# Patient Record
Sex: Female | Born: 1990 | Race: Black or African American | Hispanic: No | Marital: Single | Smoking: Current every day smoker
Health system: Southern US, Community
[De-identification: ages and names within clinical notes are randomized; demographics above are authoritative.]

## PROBLEM LIST (undated history)

## (undated) DIAGNOSIS — D649 Anemia, unspecified: Secondary | ICD-10-CM

## (undated) DIAGNOSIS — R519 Headache, unspecified: Secondary | ICD-10-CM

## (undated) DIAGNOSIS — F419 Anxiety disorder, unspecified: Secondary | ICD-10-CM

## (undated) DIAGNOSIS — F32A Depression, unspecified: Secondary | ICD-10-CM

## (undated) HISTORY — PX: ACHILLES TENDON REPAIR: SUR1153

## (undated) HISTORY — DX: Depression, unspecified: F32.A

## (undated) HISTORY — PX: TYMPANOSTOMY TUBE PLACEMENT: SHX32

## (undated) HISTORY — PX: APPENDECTOMY: SHX54

## (undated) HISTORY — DX: Headache, unspecified: R51.9

## (undated) HISTORY — PX: GASTRIC BYPASS: SHX52

## (undated) HISTORY — DX: Anemia, unspecified: D64.9

## (undated) HISTORY — DX: Anxiety disorder, unspecified: F41.9

---
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
## 2010-04-03 ENCOUNTER — Encounter: Payer: Self-pay | Admitting: *Deleted

## 2010-04-03 NOTE — Assessment & Plan Note (Signed)
Summary: headaches,tcb   Vital Signs:  Patient profile:   20 year old female Height:      66.5 inches Weight:      356.1 pounds BMI:     56.82 Temp:     97.5 degrees F oral Pulse rate:   116 / minute BP sitting:   147 / 93  (left arm) Cuff size:   large  Vitals Entered By: Gladstone Pih (April 22, 2009 3:55 PM)  Serial Vital Signs/Assessments:  Time      Position  BP       Pulse  Resp  Temp     By 3:56 PM             136/92                         Milinda Antis MD  Comments: 3:56 PM Re checked manually By: Milinda Antis MD   CC: Discuss  HA's Is Patient Diabetic? No Pain Assessment Patient in pain? no        Primary Care Provider:  Milinda Antis MD  CC:  Discuss  HA's.  History of Present Illness:   1. headaches- headaches started at the beginning of Feb. Occur 3-4x a week and last up to 2 hours. Pain described as throbbing on both temporals that moves to back of head. No photophobia, no phonophobia, no nasuea, no change in vision. Partial relief with execedrin. Brother recently diagnosed with headaches and cousin with migraines, no parethesia or weakness on ROS  No recent illness    Habits & Providers  Alcohol-Tobacco-Diet     Tobacco Status: never  Current Medications (verified): 1)  Benzamycin 5-3 % Gel (Benzoyl Peroxide-Erythromycin) .... Apply To Affected Areas On Face Two Times A Day After Washing and Drying Dispense 1 Large Tube 2)  Seasonique 0.15-0.03 &0.01 Mg Tabs (Levonorgest-Eth Estrad 91-Day) .Marland Kitchen.. 1 By Mouth Daily  Dispense 90 Day Supply 3)  Ibuprofen 600 Mg Tabs (Ibuprofen) .Marland Kitchen.. 1 By Mouth Q 6hrs As Needed Headache  Allergies (verified): No Known Drug Allergies  Review of Systems       per hpi  Physical Exam  General:  morbidly obese, NAD Vital signs noted, BP rechecked HrR 88  Head:  normocephalic and atraumatic.   Eyes:  . EOMI. Perrla. Funduscopic exam benign, without hemorrhages, exudates or papilledema. Vision grossly  normal. Heart:  RRR   Pulses:  2+ Neurologic:  alert & oriented X3, cranial nerves II-XII intact, strength normal in all extremities, sensation intact to light touch,, no clonus normal gait reflexes 1+ in lower ext, 2+upper ext- equal bilat     Impression & Recommendations:  Problem # 1:  HEADACHE (ICD-784.0) Assessment New   No clear diagnosis of migraine disorder, pt feeling some stress from school also with elevated BP which can be contributing to this . See below for headache diary, use Motrin prn Her updated medication list for this problem includes:    Ibuprofen 600 Mg Tabs (Ibuprofen) .Marland Kitchen... 1 by mouth q 6hrs as needed headache  Orders: [facility]- Est  Level 4 (99214)  Problem # 2:  ELEVATED BLOOD PRESSURE WITHOUT DIAGNOSIS OF HYPERTENSION (ICD-796.2)  Persistantly elevated bp, with weight gain/ Pt to keep BP diary for 3 weeks, if still elevated will start low dose  HCTZ or BB if still having headaches  Orders: [facility]- Est  Level 4 (04540)  Problem # 3:  OBESITY, NOS (ICD-278.00) Assessment: Deteriorated  I discussed her  weight gain and need for exercise, incrased risk for DM, heart disease. Pt is very young and while she understands does not have any motivation it appers to change at this time  Orders: [facility]- Est  Level 4 (99214)  Complete Medication List: 1)  Benzamycin 5-3 % Gel (Benzoyl peroxide-erythromycin) .... Apply to affected areas on face two times a day after washing and drying dispense 1 large tube 2)  Seasonique 0.15-0.03 &0.01 Mg Tabs (Levonorgest-eth estrad 91-day) .Marland Kitchen.. 1 by mouth daily  dispense 90 day supply 3)  Ibuprofen 600 Mg Tabs (Ibuprofen) .Marland Kitchen.. 1 by mouth q 6hrs as needed headache  Patient Instructions: 1)  Headache diary- write down when it happens, what you were doing, how long it lasted and if you took any medication- any aura (other feelings at that time) 2)  Iburprofen 600mg  at the onset of headache, every 6 hours as needed, no more than 2 a day 3)   Write down your blood pressure twice a week 4)   Come back to 3 weeks Prescriptions: SEASONIQUE 0.15-0.03 &0.01 MG TABS (LEVONORGEST-ETH ESTRAD 91-DAY) 1 by mouth daily  dispense 90 day supply  #1 x 3   Entered and Authorized by:   Milinda Antis MD   Signed by:   Milinda Antis MD on 04/22/2009   Method used:   Electronically to        [facility] [address] [phone]* (retail)       [address]       [place], [place]  [address]       Ph: [phone]       Fax: [phone]   RxID:   [id number] IBUPROFEN 600 MG TABS (IBUPROFEN) 1 by mouth q 6hrs as needed headache  #40 x 1   Entered and Authorized by:   Milinda Antis MD   Signed by:   Milinda Antis MD on 04/22/2009   Method used:   Electronically to        [facility] [address] [phone]* (retail)       [address]       [place], [place]  [address]       Ph: [phone]       Fax: [phone]   RxID:   [id number]

## 2010-04-03 NOTE — Letter (Signed)
Summary: Probation Letter  [facility]  [address]   [place], [place] [address]   Phone: [phone]  Fax: [phone]    07/08/2009  Judith Estes [address] [place], [place]  [address]  Dear Judith Estes,  With the goal of better serving all our patients the [facility] is following each patient's missed appointments.  You have missed at least 3 appointments with our practice.If you cannot keep your appointment, we expect you to call at least 24 hours before your appointment time.  Missing appointments prevents other patients from seeing [place] and makes it difficult to provide you with the best possible medical care.      1.   If you miss one more appointment, we will only give you limited medical services. This means we will not call in medication refills, complete a form, or make a referral for you except when you are here for a scheduled office visit.    2.   If you miss 2 or more appointments in the next year, we will dismiss you from our practice.    Our office staff can be reached at [phone] Monday through Friday from 8:30 a.m.-5:00 p.m. and will be glad to schedule your appointment as necessary.    Thank you.   [facility]  Appended Document: Probation Letter cert mailed  Appended Document: Probation Letter Letter returned unable to forward.

## 2010-04-03 NOTE — Miscellaneous (Signed)
Summary: Missed Derm Appt   Clinical Lists Changes  Pt missed dermatology appt on 01/09/09 with [facility] Dr. Leonie Man, she needs to reschedule this.

## 2010-05-15 LAB — LIPASE, BLOOD: Lipase: 20 U/L (ref 11–59)

## 2010-05-15 LAB — CBC
HCT: 40.6 % (ref 36.0–46.0)
Hemoglobin: 13.9 g/dL (ref 12.0–15.0)
MCH: 30.8 pg (ref 26.0–34.0)
MCV: 90.2 fL (ref 78.0–100.0)
Platelets: 318 10*3/uL (ref 150–400)
RBC: 4.5 MIL/uL (ref 3.87–5.11)
WBC: 10.6 10*3/uL — ABNORMAL HIGH (ref 4.0–10.5)

## 2010-05-15 LAB — COMPREHENSIVE METABOLIC PANEL
Albumin: 3.4 g/dL — ABNORMAL LOW (ref 3.5–5.2)
Alkaline Phosphatase: 60 U/L (ref 39–117)
BUN: 7 mg/dL (ref 6–23)
CO2: 26 mEq/L (ref 19–32)
Chloride: 107 mEq/L (ref 96–112)
Creatinine, Ser: 0.7 mg/dL (ref 0.4–1.2)
GFR calc non Af Amer: >60 mL/min (ref >60)
Glucose, Bld: 93 mg/dL (ref 70–99)
Potassium: 4.2 mEq/L (ref 3.5–5.1)
Total Bilirubin: 0.4 mg/dL (ref 0.3–1.2)

## 2010-05-15 LAB — DIFFERENTIAL
Basophils Absolute: 0 10*3/uL (ref 0.0–0.1)
Basophils Relative: 0 % (ref 0–1)
Monocytes Absolute: 0.7 10*3/uL (ref 0.1–1.0)
Neutro Abs: 7.1 10*3/uL (ref 1.7–7.7)

## 2010-05-15 LAB — URINALYSIS, ROUTINE W REFLEX MICROSCOPIC
Bilirubin Urine: NEGATIVE
Hgb urine dipstick: NEGATIVE
Nitrite: NEGATIVE
Protein, ur: NEGATIVE mg/dL
Urobilinogen, UA: 0.2 mg/dL (ref 0.0–1.0)

## 2010-06-27 ENCOUNTER — Ambulatory Visit: Payer: Self-pay | Admitting: Family Medicine

## 2010-07-03 ENCOUNTER — Ambulatory Visit: Payer: Self-pay | Admitting: Family Medicine

## 2010-08-26 ENCOUNTER — Telehealth: Payer: Self-pay | Admitting: Family Medicine

## 2010-08-26 ENCOUNTER — Emergency Department (HOSPITAL_COMMUNITY)
Admission: EM | Admit: 2010-08-26 | Discharge: 2010-08-27 | Disposition: A | Discharge status: Home / Self Care | Payer: Medicaid Other | Attending: Emergency Medicine | Admitting: Emergency Medicine

## 2010-08-26 DIAGNOSIS — IMO0001 Reserved for inherently not codable concepts without codable children: Secondary | ICD-10-CM | POA: Insufficient documentation

## 2010-08-26 DIAGNOSIS — R05 Cough: Secondary | ICD-10-CM | POA: Insufficient documentation

## 2010-08-26 DIAGNOSIS — R059 Cough, unspecified: Secondary | ICD-10-CM | POA: Insufficient documentation

## 2010-08-26 DIAGNOSIS — J069 Acute upper respiratory infection, unspecified: Secondary | ICD-10-CM | POA: Insufficient documentation

## 2010-08-26 NOTE — Telephone Encounter (Signed)
Received call from pt about 2-3 days of nasal congestion, runny nose, sore throat, generalized malaise. Unsure of sick contacts. No vomiting, diarrhea. No abd pain. No fever. Has been tolerating po well. Mild headache. No vision changes, hemi-weakness. Told pt that she is likely suffering from a viral illness. Told pt to come in am for work in visit in am if sxs still persistent. Discussed red flags for ED evaluation including intractable vomiting, diarrhea, fever despite antipyretics. Pt agreeable to plan.

## 2011-01-09 IMAGING — US US ABDOMEN COMPLETE
1 series · 13 of 25 positions shown · non-contrast
Comparison: None.

CLINICAL DATA: 19-year-old female with abdominal pain.

ABDOMINAL ULTRASOUND COMPLETE

[Series 1: us abdomen complete · 0.30mm/px · 13 of 57 slices shown]
[im 1/57]
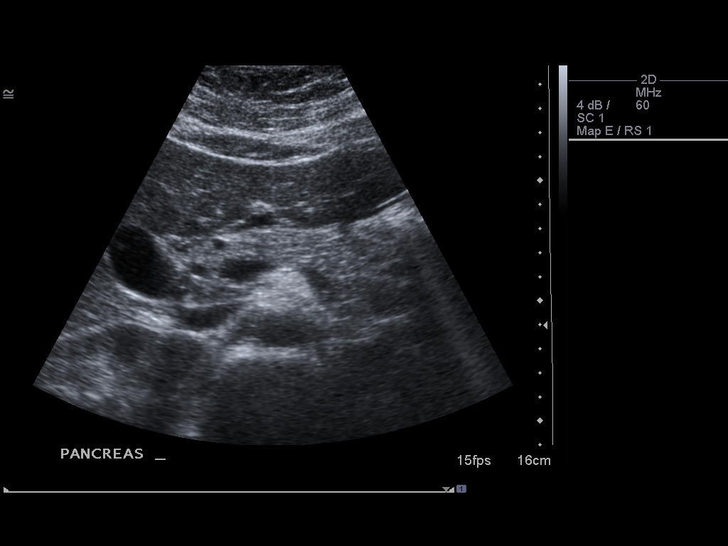
[im 5/57]
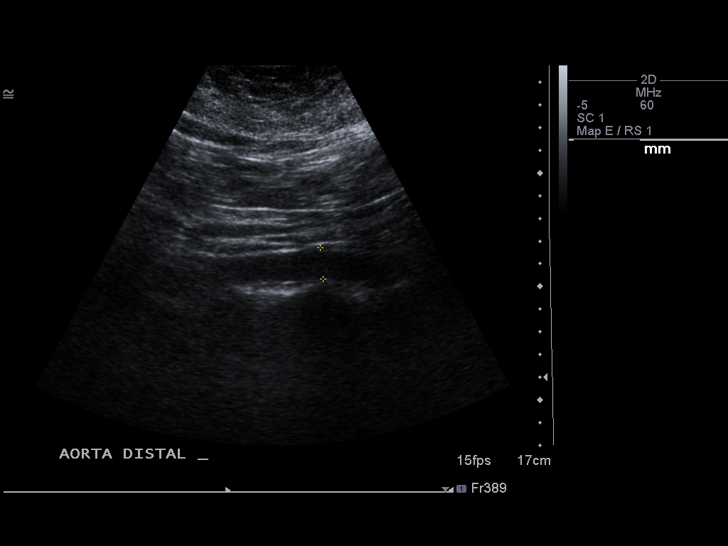
[im 10/57]
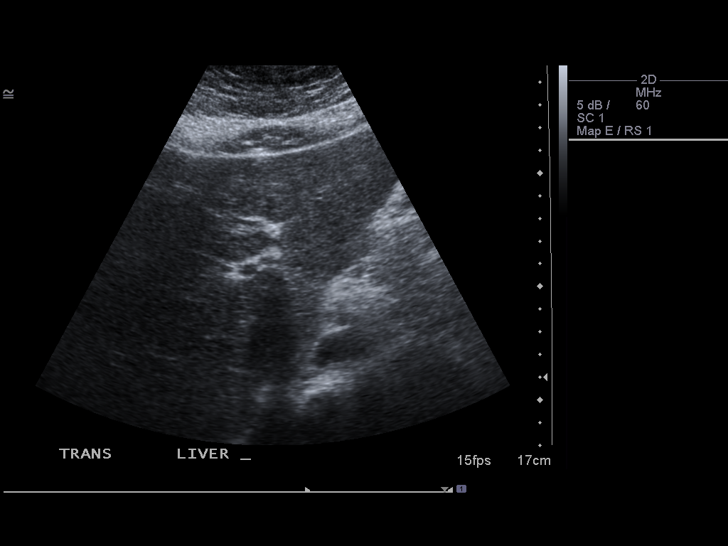
[im 15/57]
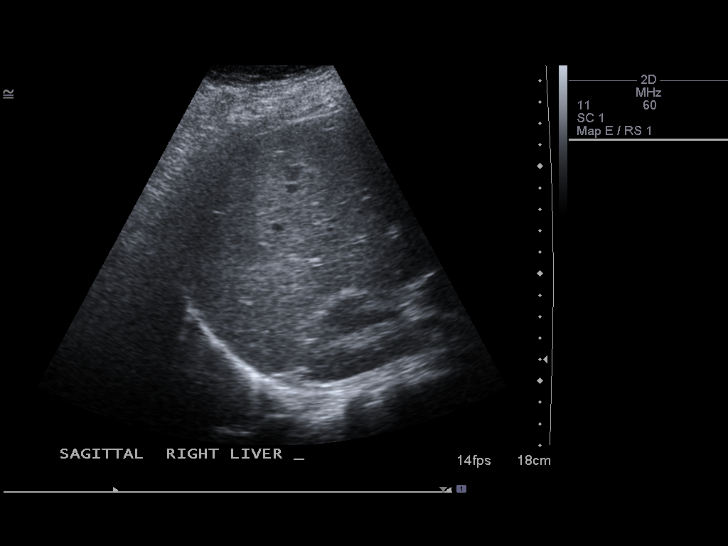
[im 19/57]
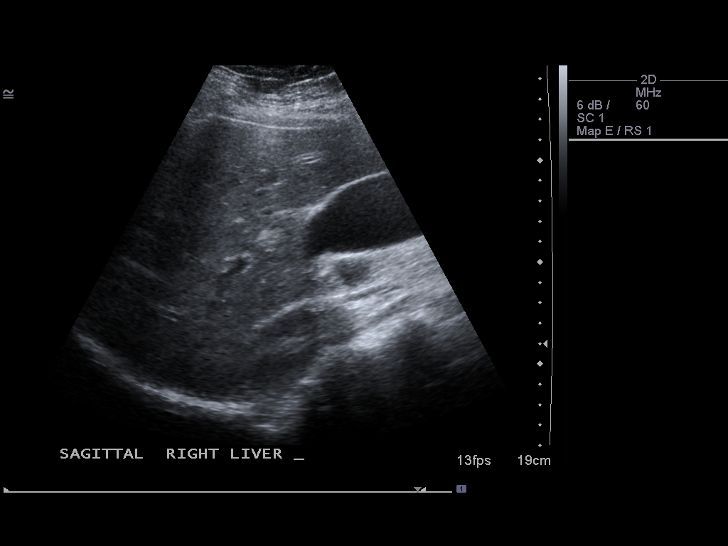
[im 24/57]
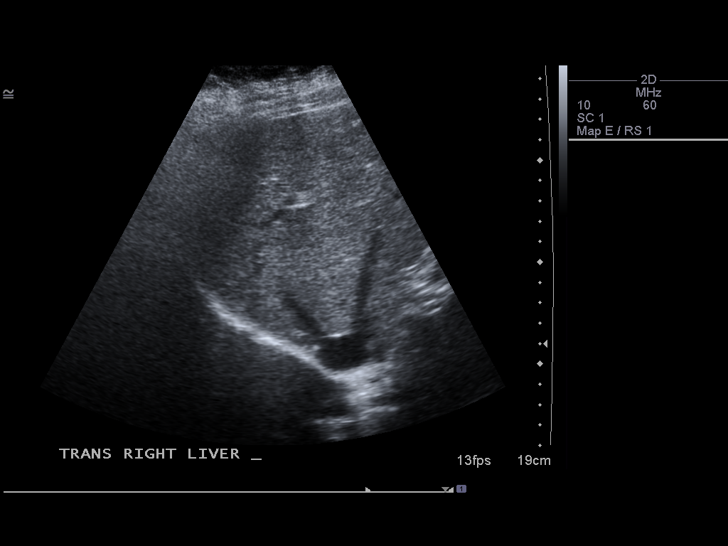
[im 29/57]
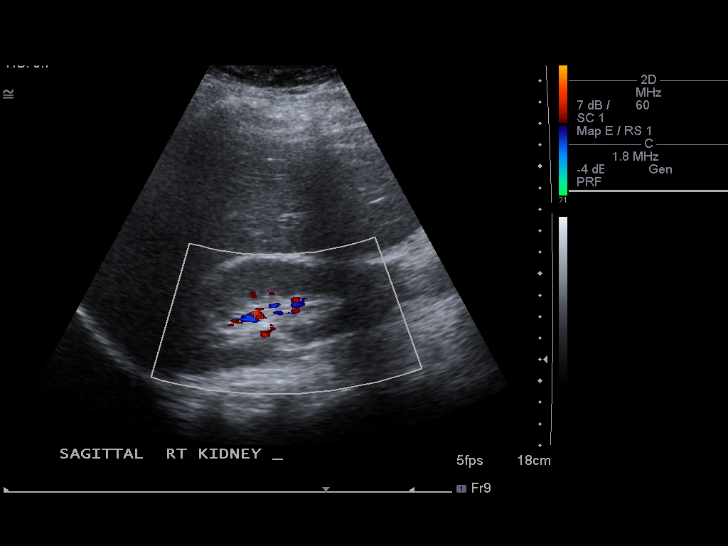
[im 33/57]
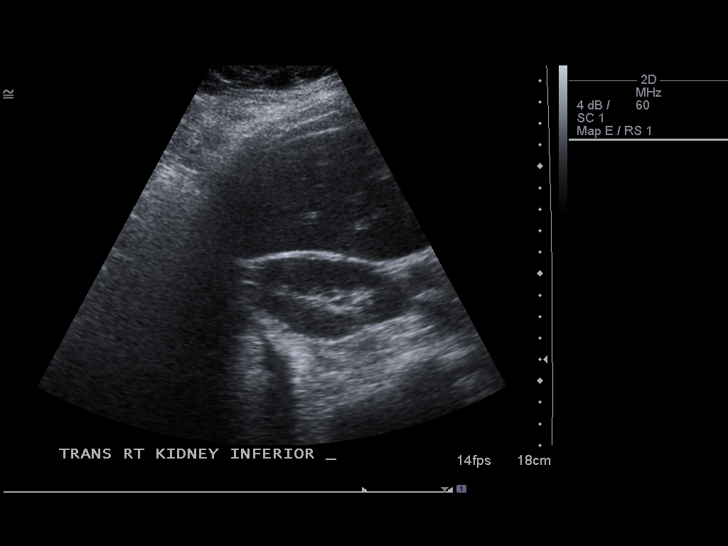
[im 38/57]
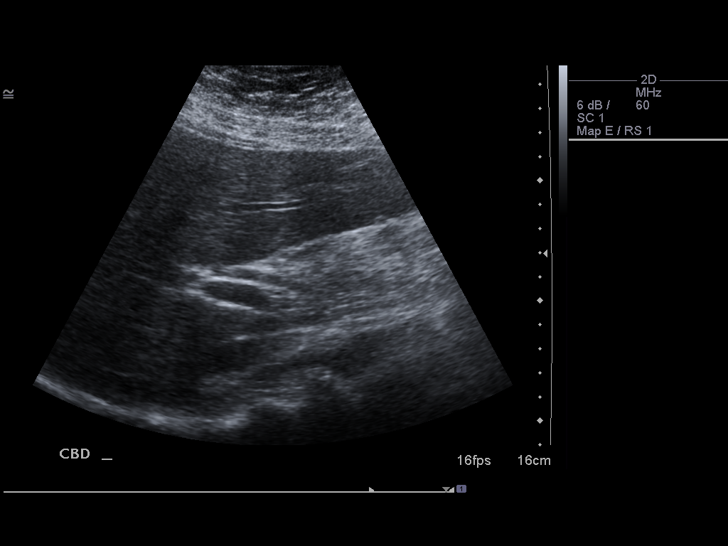
[im 43/57]
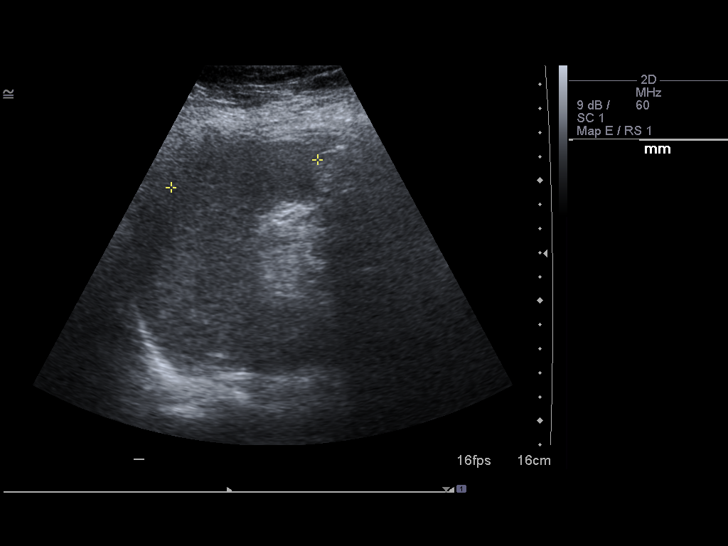
[im 47/57]
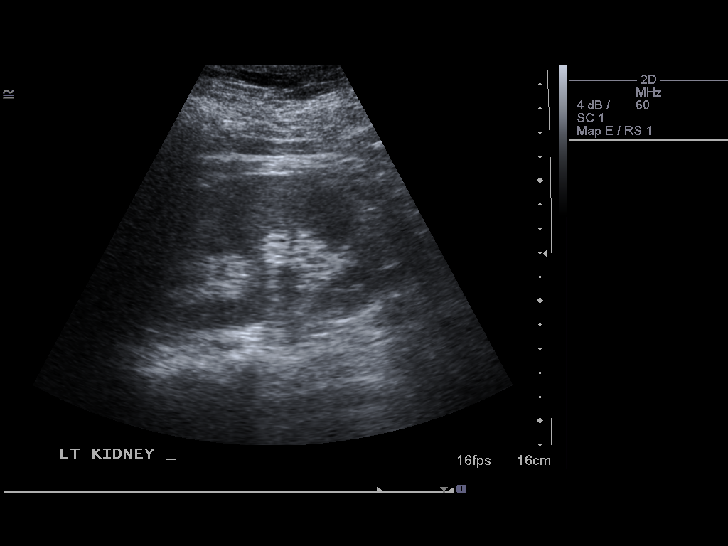
[im 52/57]
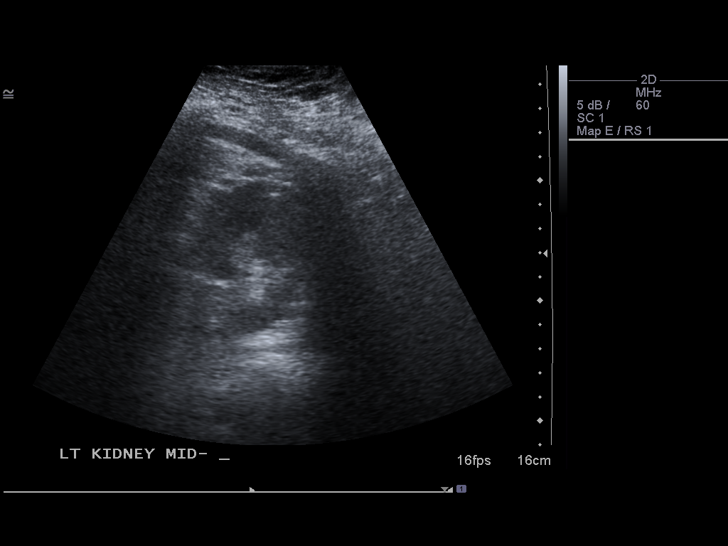
[im 57/57]
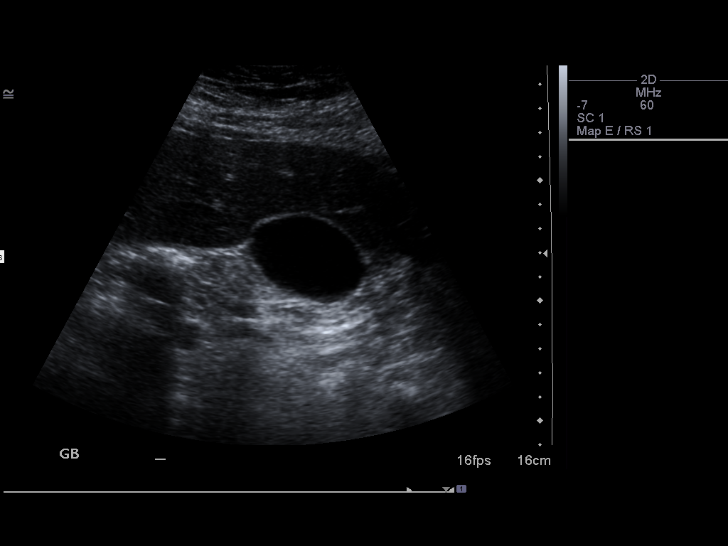

[13 of 25 positions shown; findings below may reference images not displayed]

FINDINGS: Gallbladder:  The gallbladder is unremarkable. There is no evidence
of gallstones, gallbladder wall thickening, or pericholecystic
fluid.

Common Bile Duct:  There is no evidence of intrahepatic or
extrahepatic biliary dilation. The CBD measures 4.4 mm in greatest
diameter.

Liver:  The liver is within normal limits in parenchymal
echogenicity. No focal abnormalities are identified.

IVC:  Appears normal.

Pancreas:  Although the pancreas is difficult to visualize in its
entirety, no focal pancreatic abnormality is identified.

Spleen:  Within normal limits in size and echotexture.

Right kidney:  The right kidney is normal in size and parenchymal
echogenicity.  There is no evidence of solid mass, hydronephrosis
or definite renal calculi.  The right kidney measures 10.8 cm.

Left kidney:  The left kidney is normal in size and parenchymal
echogenicity.  There is no evidence of solid mass, hydronephrosis
or definite renal calculi.   The left kidney measures 11.7 cm.

Abdominal Aorta:  No abdominal aortic aneurysm identified.

There is no evidence of ascites.
IMPRESSION: Negative abdominal ultrasound.

## 2011-04-28 ENCOUNTER — Encounter: Payer: Medicaid Other | Admitting: Emergency Medicine

## 2013-05-05 DIAGNOSIS — N915 Oligomenorrhea, unspecified: Secondary | ICD-10-CM | POA: Insufficient documentation

## 2013-05-05 DIAGNOSIS — G43109 Migraine with aura, not intractable, without status migrainosus: Secondary | ICD-10-CM | POA: Insufficient documentation

## 2015-09-30 DIAGNOSIS — G4730 Sleep apnea, unspecified: Secondary | ICD-10-CM | POA: Insufficient documentation

## 2015-09-30 DIAGNOSIS — G4700 Insomnia, unspecified: Secondary | ICD-10-CM | POA: Insufficient documentation

## 2016-03-21 DIAGNOSIS — K3580 Unspecified acute appendicitis: Secondary | ICD-10-CM | POA: Insufficient documentation

## 2016-09-08 ENCOUNTER — Telehealth: Payer: Self-pay

## 2016-09-08 NOTE — Telephone Encounter (Signed)
Immunization Record faxed to [phone] per patient request.  Clovis PuMartin, Kayde Warehime L, RN

## 2016-09-08 NOTE — Telephone Encounter (Signed)
Immunization printed, waiting for fax number.  Clovis PuMartin, Tamika L, RN

## 2016-09-08 NOTE — Telephone Encounter (Signed)
Patient calling requesting a copy of her shot record for a new job she is starting. [place] now [place]. She did not have the fax number available where she would like the shot sent. Patients states she will call back with the fax number. Please call patient if there is a issue with obtaining her shot record. [phone].

## 2020-05-06 ENCOUNTER — Emergency Department (HOSPITAL_COMMUNITY): Payer: BC Managed Care – PPO

## 2020-05-06 ENCOUNTER — Emergency Department (HOSPITAL_COMMUNITY)
Admission: EM | Admit: 2020-05-06 | Discharge: 2020-05-06 | Disposition: A | Discharge status: Home / Self Care | Payer: BC Managed Care – PPO | Attending: Emergency Medicine | Admitting: Emergency Medicine

## 2020-05-06 ENCOUNTER — Encounter (HOSPITAL_COMMUNITY): Payer: Self-pay

## 2020-05-06 ENCOUNTER — Other Ambulatory Visit: Payer: Self-pay

## 2020-05-06 DIAGNOSIS — J020 Streptococcal pharyngitis: Secondary | ICD-10-CM | POA: Diagnosis not present

## 2020-05-06 DIAGNOSIS — Z20822 Contact with and (suspected) exposure to covid-19: Secondary | ICD-10-CM | POA: Insufficient documentation

## 2020-05-06 DIAGNOSIS — R059 Cough, unspecified: Secondary | ICD-10-CM | POA: Diagnosis present

## 2020-05-06 LAB — RESP PANEL BY RT-PCR (FLU A&B, COVID) ARPGX2
Influenza A by PCR: NEGATIVE
Influenza B by PCR: NEGATIVE
SARS Coronavirus 2 by RT PCR: NEGATIVE

## 2020-05-06 LAB — GROUP A STREP BY PCR: Group A Strep by PCR: DETECTED — AB

## 2020-05-06 MED ORDER — PENICILLIN G BENZATHINE 1200000 UNIT/2ML IM SUSP
1.2000 10*6.[IU] | Freq: Once | INTRAMUSCULAR | Status: AC
  Administered 2020-05-06: 1.2 10*6.[IU] via INTRAMUSCULAR
  Filled 2020-05-06: qty 2

## 2020-05-06 MED ORDER — DEXAMETHASONE SODIUM PHOSPHATE 10 MG/ML IJ SOLN
10.0000 mg | Freq: Once | INTRAMUSCULAR | Status: AC
  Administered 2020-05-06: 10 mg via INTRAMUSCULAR
  Filled 2020-05-06: qty 1

## 2020-05-06 NOTE — Discharge Instructions (Signed)
Please follow up with your primary care provider within 5-7 days for re-evaluation of your symptoms. If you do not have a primary care provider, information for a healthcare clinic has been provided for you to make arrangements for follow up care. Please return to the emergency department for any new or worsening symptoms. ° °

## 2020-05-06 NOTE — ED Notes (Signed)
An After Visit Summary was printed and given to the patient. Discharge instructions given and no further questions at this time.  

## 2020-05-06 NOTE — ED Provider Notes (Signed)
[facility]-EMERGENCY DEPT Provider Note   CSN: [id number] Arrival date & time: 05/06/20  1446     History Chief Complaint  Patient presents with  . Chills  . Cough  . Generalized Body Aches    Judith Estes is a 30 y.o. female.  HPI   30 year old female presenting emergency department today for evaluation of URI symptoms.  States for the last week she has had a sore throat, cough, body aches, sweats, chills and subjective fevers.  She has been vaccinated against Covid and denies any known exposures.  She has been taking Tylenol without resolution of symptoms.  History reviewed. No pertinent past medical history.  Patient Active Problem List   Diagnosis Date Noted  . UPPER RESPIRATORY INFECTION, ACUTE 01/04/2009  . ELEVATED BLOOD PRESSURE WITHOUT DIAGNOSIS OF HYPERTENSION 01/04/2009  . 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 CONTUSION OF KNEE 08/06/2008  . ALLERGIC RHINITIS, SEASONAL 06/20/2008  . SHOULDER PAIN, RIGHT 06/20/2008  . Headache(784.0) 06/20/2008  . OBESITY, NOS 04/29/2006    History reviewed. No pertinent surgical history.   OB History   No obstetric history on file.     History reviewed. No pertinent family history.     Home Medications Prior to Admission medications   Medication Sig Start Date End Date Taking? Authorizing Provider  benzoyl peroxide-erythromycin (BENZAMYCIN) gel Apply topically 2 (two) times daily. to affected areas on face after washing and drying     [provider]  ibuprofen (ADVIL,MOTRIN) 600 MG tablet Take 600 mg by mouth every 6 (six) hours as needed. for headache     [provider]  Judith Estes 91-Day (SEASONIQUE) 0.15-0.03 &0.01 MG TABS Take 1 tablet by mouth daily.      [provider]    Allergies    Patient has no known allergies.  Review of Systems   Review of Systems  Constitutional: Positive for chills, diaphoresis and fever.  HENT: Positive for sore  throat.   Eyes: Negative for visual disturbance.  Respiratory: Positive for cough. Negative for shortness of breath.   Cardiovascular: Negative for chest pain.  Gastrointestinal: Negative for abdominal pain.  Genitourinary: Negative for pelvic pain.  Musculoskeletal: Positive for myalgias. Negative for back pain.  Neurological: Positive for headaches.    Physical Exam Updated Vital Signs BP 138/88   Pulse 89   Temp 100.2 F (37.9 C) (Oral)   Resp 18   Ht 5\' 6"  (1.676 m)   Wt 125.2 kg   LMP 04/12/2020 (Approximate)   SpO2 99%   BMI 44.55 kg/m   Physical Exam Vitals and nursing note reviewed.  Constitutional:      General: She is not in acute distress.    Appearance: She is well-developed and well-nourished.  HENT:     Head: Normocephalic and atraumatic.     Mouth/Throat:     Pharynx: Uvula midline. Oropharyngeal exudate and posterior oropharyngeal erythema present.     Tonsils: Tonsillar exudate present. No tonsillar abscesses. 2+ on the right. 2+ on the left.  Eyes:     Conjunctiva/sclera: Conjunctivae normal.  Cardiovascular:     Rate and Rhythm: Normal rate and regular rhythm.     Heart sounds: No murmur heard.   Pulmonary:     Effort: Pulmonary effort is normal. No respiratory distress.     Breath sounds: Normal breath sounds.  Abdominal:     Palpations: Abdomen is soft.     Tenderness: There is no abdominal tenderness.  Musculoskeletal:  General: No edema.     Cervical back: Neck supple.  Skin:    General: Skin is warm and dry.  Neurological:     Mental Status: She is alert.  Psychiatric:        Mood and Affect: Mood and affect normal.     ED Results / Procedures / Treatments   Labs (all labs ordered are listed, but only abnormal results are displayed) Labs Reviewed  GROUP A STREP BY PCR - Abnormal; Notable for the following components:      Result Value   Group A Strep by PCR DETECTED (*)    All other components within normal limits  RESP  PANEL BY RT-PCR (FLU A&B, COVID) [id number]    EKG None  Radiology DG Chest Portable 1 View  Result Date: 05/06/2020 CLINICAL DATA:  Cough, chills and body aches x1 week. EXAM: PORTABLE CHEST 1 VIEW COMPARISON:  Jul 05, 2007 FINDINGS: The heart size and mediastinal contours are within normal limits. Both lungs are clear. The visualized skeletal structures are unremarkable. IMPRESSION: No active disease. Electronically Signed   By: Aram Candela M.D.   On: 05/06/2020 16:12    Procedures Procedures   Medications Ordered in ED Medications  penicillin g benzathine (BICILLIN LA) 1200000 UNIT/2ML injection 1.2 Million Units (has no administration in time range)  dexamethasone (DECADRON) injection 10 mg (has no administration in time range)    ED Course  I have reviewed the triage vital signs and the nursing notes.  Pertinent labs & imaging results that were available during my care of the patient were reviewed by me and considered in my medical decision making (see chart for details).    MDM Rules/Calculators/A&P                          Pt febrile with tonsillar exudate, cervical lymphadenopathy, & dysphagia; diagnosis of strep. Treated in the Ed with steroids  and PCN IM. Presentation non concerning for PTA or infxn spread to soft tissue. No trismus or uvula deviation. covid test neg and cxr neg. Tessalon given for cough. Specific return precautions discussed. Pt able to drink water in ED without difficulty with intact air way. Recommended PCP follow up.    Final Clinical Impression(s) / ED Diagnoses Final diagnoses:  Strep throat  Cough    Rx / DC Orders ED Discharge Orders    None       Judith Estes 05/06/20 1854    Judith Fines, MD 05/07/20 1451

## 2020-05-06 NOTE — ED Triage Notes (Signed)
Pt presents with c/o cough, chills, body aches since last week.

## 2020-05-10 ENCOUNTER — Emergency Department (HOSPITAL_COMMUNITY)
Admission: EM | Admit: 2020-05-10 | Discharge: 2020-05-11 | Disposition: A | Discharge status: Home / Self Care | Payer: BC Managed Care – PPO | Attending: Emergency Medicine | Admitting: Emergency Medicine

## 2020-05-10 ENCOUNTER — Encounter (HOSPITAL_COMMUNITY): Payer: Self-pay

## 2020-05-10 ENCOUNTER — Other Ambulatory Visit: Payer: Self-pay

## 2020-05-10 DIAGNOSIS — F17210 Nicotine dependence, cigarettes, uncomplicated: Secondary | ICD-10-CM | POA: Insufficient documentation

## 2020-05-10 DIAGNOSIS — J029 Acute pharyngitis, unspecified: Secondary | ICD-10-CM | POA: Diagnosis not present

## 2020-05-10 DIAGNOSIS — J020 Streptococcal pharyngitis: Secondary | ICD-10-CM

## 2020-05-10 LAB — CBC WITH DIFFERENTIAL/PLATELET
Band Neutrophils: 2 %
Basophils Relative: 0 %
Blasts: 0 %
Eosinophils Relative: 0 %
HCT: 42.3 % (ref 36.0–46.0)
Hemoglobin: 14 g/dL (ref 12.0–15.0)
Immature Granulocytes: 0 %
Lymphocytes Relative: 13 %
MCH: 30.8 pg (ref 26.0–34.0)
MCHC: 33.1 g/dL (ref 30.0–36.0)
MCV: 93.2 fL (ref 80.0–100.0)
Metamyelocytes Relative: 0 %
Monocytes Relative: NONREACTIVE %
Myelocytes: 0 %
Neutrophils Relative %: 71 %
Platelets: 246 10*3/uL (ref 150–400)
Promyelocytes Relative: 0 %
RBC: 4.54 MIL/uL (ref 3.87–5.11)
RDW: 13.7 % (ref 11.5–15.5)
WBC: 10 10*3/uL (ref 4.0–10.5)
nRBC: 0 % (ref 0.0–0.2)
nRBC: 1 /100 WBC — ABNORMAL HIGH

## 2020-05-10 LAB — BASIC METABOLIC PANEL
Anion gap: 12 (ref 5–15)
BUN: 9 mg/dL (ref 6–20)
CO2: 20 mmol/L — ABNORMAL LOW (ref 22–32)
Calcium: 8.7 mg/dL — ABNORMAL LOW (ref 8.9–10.3)
Chloride: 103 mmol/L (ref 98–111)
Creatinine, Ser: 0.85 mg/dL (ref 0.44–1.00)
GFR, Estimated: >60 mL/min (ref >60)
Glucose, Bld: 89 mg/dL (ref 70–99)
Potassium: 3.9 mmol/L (ref 3.5–5.1)
Sodium: 135 mmol/L (ref 135–145)

## 2020-05-10 LAB — MONONUCLEOSIS SCREEN: Mono Screen: NONREACTIVE — AB

## 2020-05-10 LAB — I-STAT BETA HCG BLOOD, ED (MC, WL, AP ONLY): I-stat hCG, quantitative: <5 m[IU]/mL (ref <5)

## 2020-05-10 MED ORDER — LIDOCAINE VISCOUS HCL 2 % MT SOLN: 10.0000 mL | Freq: Four times a day (QID) | OROMUCOSAL | 0 refills | Status: DC | PRN

## 2020-05-10 MED ORDER — SODIUM CHLORIDE 0.9 % IV BOLUS
1000.0000 mL | Freq: Once | INTRAVENOUS | Status: AC
  Administered 2020-05-10: 1000 mL via INTRAVENOUS

## 2020-05-10 MED ORDER — KETOROLAC TROMETHAMINE 30 MG/ML IJ SOLN
30.0000 mg | Freq: Once | INTRAMUSCULAR | Status: AC
  Administered 2020-05-10: 30 mg via INTRAVENOUS
  Filled 2020-05-10: qty 1

## 2020-05-10 MED ORDER — DEXAMETHASONE SODIUM PHOSPHATE 10 MG/ML IJ SOLN
10.0000 mg | Freq: Once | INTRAMUSCULAR | Status: DC
  Filled 2020-05-10: qty 1

## 2020-05-10 MED ORDER — NAPROXEN 375 MG PO TABS: 375.0000 mg | ORAL_TABLET | Freq: Two times a day (BID) | ORAL | 0 refills | Status: DC

## 2020-05-10 MED ORDER — DEXAMETHASONE SODIUM PHOSPHATE 10 MG/ML IJ SOLN
10.0000 mg | Freq: Once | INTRAMUSCULAR | Status: AC
  Administered 2020-05-10: 10 mg via INTRAVENOUS

## 2020-05-10 MED ORDER — LIDOCAINE VISCOUS HCL 2 % MT SOLN
15.0000 mL | Freq: Once | OROMUCOSAL | Status: AC
  Administered 2020-05-10: 15 mL via OROMUCOSAL
  Filled 2020-05-10: qty 15

## 2020-05-10 MED ORDER — HYDROCODONE-HOMATROPINE 5-1.5 MG/5ML PO SYRP: 5.0000 mL | ORAL_SOLUTION | Freq: Four times a day (QID) | ORAL | 0 refills | Status: DC | PRN

## 2020-05-10 NOTE — ED Notes (Addendum)
Patient refused strep and Covid tests. PA made aware.

## 2020-05-10 NOTE — ED Triage Notes (Signed)
patient states she was seen  In the ED 4 days and was diagnosed with strep throat. Patient states her throat is worse and is having difficulty swallowing.

## 2020-05-10 NOTE — Discharge Instructions (Signed)
Contact a health care provider if: The glands in your neck continue to get bigger. You develop a rash, cough, or earache. You cough up a thick mucus that is green, yellow-brown, or bloody. You have pain or discomfort that does not get better with medicine. Your symptoms seem to be getting worse and not better. You have a fever. Get help right away if: You have new symptoms, such as vomiting, severe headache, stiff or painful neck, chest pain, or shortness of breath. You have severe throat pain, drooling, or changes in your voice. You have swelling of the neck, or the skin on the neck becomes red and tender. You have signs of dehydration, such as tiredness (fatigue), dry mouth, and decreased urination. You become increasingly sleepy, or you cannot wake up completely. Your joints become red or painful.

## 2020-05-10 NOTE — ED Provider Notes (Signed)
[facility]-EMERGENCY DEPT Provider Note   CSN: [id number] Arrival date & time: 05/10/20  1841     History Chief Complaint  Patient presents with  . Sore Throat  . Generalized Body Aches    Judith Estes is a 30 y.o. female who presents emergency department chief complaint of sore throat.  Patient was seen Monday of this week, had a strep test which was positive and a Covid test which was negative.  She was treated with penicillin and steroid states that she got no relief and nothing seemed to get better.  2 days later she went to the urgent care where she was started on clindamycin and given another dose of steroid without any improvement.  She states that she cannot tolerate swallowing her own saliva but just barely and has not been able to drink or eat.  HPI     History reviewed. No pertinent past medical history.  Patient Active Problem List   Diagnosis Date Noted  . UPPER RESPIRATORY INFECTION, ACUTE 01/04/2009  . ELEVATED BLOOD PRESSURE WITHOUT DIAGNOSIS OF HYPERTENSION 01/04/2009  . 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 CONTUSION OF KNEE 08/06/2008  . ALLERGIC RHINITIS, SEASONAL 06/20/2008  . SHOULDER PAIN, RIGHT 06/20/2008  . Headache(784.0) 06/20/2008  . OBESITY, NOS 04/29/2006    No past surgical history on file.   OB History   No obstetric history on file.     Family History  Problem Relation Age of Onset  . Hypertension Mother   . Hypertension Father     Social History   Tobacco Use  . Smoking status: Current Every Day Smoker    Packs/day: 0.25    Types: Cigarettes  . Smokeless tobacco: Never Used  Substance Use Topics  . Alcohol use: Never  . Drug use: Never    Home Medications Prior to Admission medications   Medication Sig Start Date End Date Taking? Authorizing Provider  benzoyl peroxide-erythromycin (BENZAMYCIN) gel Apply topically 2 (two) times daily. to affected areas on face after washing and drying      [provider]  ibuprofen (ADVIL,MOTRIN) 600 MG tablet Take 600 mg by mouth every 6 (six) hours as needed. for headache     [provider]  Terri Piedra 91-Day (SEASONIQUE) 0.15-0.03 &0.01 MG TABS Take 1 tablet by mouth daily.      [provider]    Allergies    Patient has no known allergies.  Review of Systems   Review of Systems Ten systems reviewed and are negative for acute change, except as noted in the HPI.   Physical Exam Updated Vital Signs BP 113/68   Pulse 65   Temp 100 F (37.8 C) (Oral)   Resp 18   Ht 5\' 6"  (1.676 m)   Wt 126.1 kg   LMP 04/12/2020 (Approximate)   SpO2 95%   BMI 44.87 kg/m   Physical Exam Vitals and nursing note reviewed.  Constitutional:      General: She is not in acute distress.    Appearance: She is well-developed. She is not diaphoretic.  HENT:     Head: Normocephalic and atraumatic.     Right Ear: Tympanic membrane normal.     Left Ear: Tympanic membrane normal.     Mouth/Throat:     Pharynx: Uvula midline.     Tonsils: Tonsillar exudate and tonsillar abscess present. 3+ on the right. 3+ on the left.  Eyes:     General: No scleral icterus.  Conjunctiva/sclera: Conjunctivae normal.  Cardiovascular:     Rate and Rhythm: Normal rate and regular rhythm.     Heart sounds: Normal heart sounds. No murmur heard. No friction rub. No gallop.   Pulmonary:     Effort: Pulmonary effort is normal. No respiratory distress.     Breath sounds: Normal breath sounds.  Abdominal:     General: Bowel sounds are normal. There is no distension.     Palpations: Abdomen is soft. There is no mass.     Tenderness: There is no abdominal tenderness. There is no guarding.  Musculoskeletal:     Cervical back: Normal range of motion.  Skin:    General: Skin is warm and dry.  Neurological:     Mental Status: She is alert and oriented to person, place, and time.  Psychiatric:        Behavior: Behavior normal.      ED Results / Procedures / Treatments   Labs (all labs ordered are listed, but only abnormal results are displayed) Labs Reviewed  CBC WITH DIFFERENTIAL/PLATELET - Abnormal; Notable for the following components:      Result Value   nRBC 1 (*)    All other components within normal limits  BASIC METABOLIC PANEL - Abnormal; Notable for the following components:   CO2 20 (*)    Calcium 8.7 (*)    All other components within normal limits  MONONUCLEOSIS SCREEN - Abnormal; Notable for the following components:   Mono Screen NON REACTIVE (*)    All other components within normal limits  I-STAT BETA HCG BLOOD, ED ([facility], [facility], [facility] ONLY)  GC/CHLAMYDIA PROBE AMP ([facility]) NOT AT [facility]    EKG None  Radiology No results found.  Procedures Procedures   Medications Ordered in ED Medications  sodium chloride 0.9 % bolus 1,000 mL (0 mLs Intravenous Stopped 05/10/20 2158)  ketorolac (TORADOL) 30 MG/ML injection 30 mg (30 mg Intravenous Given 05/10/20 2032)  lidocaine (XYLOCAINE) 2 % viscous mouth solution 15 mL (15 mLs Mouth/Throat Given 05/10/20 2026)  dexamethasone (DECADRON) injection 10 mg (10 mg Intravenous Given 05/10/20 2032)    ED Course  I have reviewed the triage vital signs and the nursing notes.  Pertinent labs & imaging results that were available during my care of the patient were reviewed by me and considered in my medical decision making (see chart for details).    MDM Rules/Calculators/A&P                         30 year old female here with significant sore throat.  Thick almost pseudomembranous exudates over bilateral tonsils, tongue is coated.  I ordered and reviewed labs which included pregnancy test, CBC without abnormality, BMP with mildly low bicarb of insignificant value.  She does have atypical lymphocytes which may indicate a positive mono test however her mono test was negative today.  Patient was given fluids, Decadron, Toradol and viscous lidocaine and states  that she is feeling somewhat better.  Her heart rate has normalized with fluids.  PDMP reviewed during this encounter. Discharge patient with Hycodan, viscous lidocaine and naproxen.  She has clindamycin at home and may continue taking this medication.  Appears otherwise appropriate for discharge at this time and may follow-up with ENT.  Final Clinical Impression(s) / ED Diagnoses Final diagnoses:  None    Rx / DC Orders ED Discharge Orders    None       Arthor Captain, PA-C 05/10/20  3235    Rolan Bucco, MD 05/10/20 2355

## 2020-05-12 LAB — GC/CHLAMYDIA PROBE AMP (~~LOC~~) NOT AT ARMC
Chlamydia: NEGATIVE
Comment: NEGATIVE
Comment: NORMAL
Neisseria Gonorrhea: NEGATIVE

## 2020-07-15 DIAGNOSIS — D649 Anemia, unspecified: Secondary | ICD-10-CM | POA: Diagnosis not present

## 2020-07-15 DIAGNOSIS — R42 Dizziness and giddiness: Secondary | ICD-10-CM | POA: Diagnosis not present

## 2020-09-04 DIAGNOSIS — M25572 Pain in left ankle and joints of left foot: Secondary | ICD-10-CM | POA: Diagnosis not present

## 2020-09-13 DIAGNOSIS — M6788 Other specified disorders of synovium and tendon, other site: Secondary | ICD-10-CM | POA: Diagnosis not present

## 2020-09-13 DIAGNOSIS — M79672 Pain in left foot: Secondary | ICD-10-CM | POA: Diagnosis not present

## 2020-09-13 DIAGNOSIS — Z87828 Personal history of other (healed) physical injury and trauma: Secondary | ICD-10-CM | POA: Diagnosis not present

## 2020-09-23 ENCOUNTER — Ambulatory Visit: Payer: 59 | Admitting: Podiatry

## 2020-09-30 ENCOUNTER — Other Ambulatory Visit: Payer: Self-pay

## 2020-09-30 ENCOUNTER — Encounter: Payer: Self-pay | Admitting: Podiatry

## 2020-09-30 ENCOUNTER — Ambulatory Visit (INDEPENDENT_AMBULATORY_CARE_PROVIDER_SITE_OTHER): Payer: 59 | Admitting: Podiatry

## 2020-09-30 DIAGNOSIS — G8929 Other chronic pain: Secondary | ICD-10-CM | POA: Diagnosis not present

## 2020-09-30 DIAGNOSIS — M7662 Achilles tendinitis, left leg: Secondary | ICD-10-CM

## 2020-09-30 DIAGNOSIS — M79672 Pain in left foot: Secondary | ICD-10-CM | POA: Diagnosis not present

## 2020-09-30 MED ORDER — METHYLPREDNISOLONE 4 MG PO TBPK: ORAL_TABLET | ORAL | 0 refills | Status: DC

## 2020-09-30 NOTE — Patient Instructions (Signed)

## 2020-10-03 NOTE — Progress Notes (Signed)
Subjective:   Patient ID: Judith Estes, female   DOB: 30 y.o.   MRN: [id number]   HPI 30 year old female presents the office today for discomfort of the back of her left heel pointing Achilles tendon.  She states that she previously went to urgent care in [facility] associated bone spur.  She states that she gets discomfort to the left back of her heel more consistently.  Gets occasional swelling.  Describes a throbbing sensation.  She has a history of a right Achilles tendon rupture with an apparent [facility] transfer.  This was done at [facility] in [place].  She gets occasional discomfort of the area still.   Review of Systems  All other systems reviewed and are negative.  History reviewed. No pertinent past medical history.  History reviewed. No pertinent surgical history.   Current Outpatient Medications:    acetaminophen (TYLENOL) 500 MG tablet, Take by mouth., Disp: , Rfl:    azithromycin (ZITHROMAX) 500 MG tablet, take 2 tablets by mouth ONCE FOR 1 DOSE, Disp: , Rfl:    benzonatate (TESSALON) 100 MG capsule, Take by mouth., Disp: , Rfl:    cetirizine (ZYRTEC) 10 MG tablet, Take 1 tablet by mouth daily., Disp: , Rfl:    citalopram (CELEXA) 10 MG tablet, , Disp: , Rfl:    dicyclomine (BENTYL) 20 MG tablet, Take by mouth., Disp: , Rfl:    Docusate Sodium (DSS) 100 MG CAPS, Take by mouth., Disp: , Rfl:    medroxyPROGESTERone (PROVERA) 10 MG tablet, Take by mouth., Disp: , Rfl:    methylPREDNISolone (MEDROL DOSEPAK) 4 MG TBPK tablet, Take as directed, Disp: 21 tablet, Rfl: 0   ondansetron (ZOFRAN-ODT) 4 MG disintegrating tablet, Take by mouth., Disp: , Rfl:    polyethylene glycol powder (GLYCOLAX/MIRALAX) 17 GM/SCOOP powder, Take by mouth., Disp: , Rfl:    benzoyl peroxide-erythromycin (BENZAMYCIN) gel, Apply topically 2 (two) times daily. to affected areas on face after washing and drying , Disp: , Rfl:    clindamycin (CLEOCIN) 300 MG capsule, Take 300 mg by mouth 3  (three) times daily., Disp: , Rfl:    [employer] DICLOFENAC SODIUM 1 % GEL, Apply 4 g topically 4 (four) times daily., Disp: , Rfl:    HYDROcodone-acetaminophen (NORCO/VICODIN) 5-325 MG tablet, Take 1 tablet by mouth every 6 (six) hours as needed., Disp: , Rfl:    HYDROcodone-homatropine (HYCODAN) 5-1.5 MG/5ML syrup, Take 5 mLs by mouth every 6 (six) hours as needed for cough., Disp: 120 mL, Rfl: 0   ibuprofen (ADVIL,MOTRIN) 600 MG tablet, Take 600 mg by mouth every 6 (six) hours as needed. for headache , Disp: , Rfl:    Levonorgest-Eth Estrad 91-Day (SEASONIQUE) 0.15-0.03 &0.01 MG TABS, Take 1 tablet by mouth daily.  , Disp: , Rfl:    lidocaine (XYLOCAINE) 2 % solution, Use as directed 10 mLs in the mouth or throat every 6 (six) hours as needed for mouth pain., Disp: 200 mL, Rfl: 0   metroNIDAZOLE (FLAGYL) 500 MG tablet, Take 500 mg by mouth 2 (two) times daily., Disp: , Rfl:    naproxen (NAPROSYN) 375 MG tablet, Take 1 tablet (375 mg total) by mouth 2 (two) times daily with a meal., Disp: 20 tablet, Rfl: 0   predniSONE (DELTASONE) 20 MG tablet, PLEASE SEE ATTACHED FOR DETAILED DIRECTIONS, Disp: , Rfl:    sulfamethoxazole-trimethoprim (BACTRIM DS) 800-160 MG tablet, SMARTSIG:1 Tablet(s) By Mouth Every 12 Hours, Disp: , Rfl:    traZODone (DESYREL) 50 MG tablet, Take  by mouth., Disp: , Rfl:    valACYclovir (VALTREX) 1000 MG tablet, SMARTSIG:1 Tablet(s) By Mouth Every 12 Hours, Disp: , Rfl:   Allergies  Allergen Reactions   Gramineae Pollens Itching          Objective:  Physical Exam  General: AAO x3, NAD  Dermatological: Skin is warm, dry and supple bilateral. Scar from the prior surgery in the right side is well-healed.  There are no open sores, no preulcerative lesions, no rash or signs of infection present.  Vascular: Dorsalis Pedis artery and Posterior Tibial artery pedal pulses are 2/4 bilateral with immedate capillary fill time. There is no pain with calf compression, swelling, warmth,  erythema.   Neruologic: Grossly intact via light touch bilateral.   Musculoskeletal: There is tenderness palpation of the distal portion of the Achilles tendon just proximal to the insertion of the calcaneus coursing towards the insertion.  Thompson test is negative.  There is trace edema.  No erythema or warmth.  No pain with lateral compression of calcaneus.  No pain to the plantar calcaneus.  No other area discomfort.  Equinus is present.  Flatfoot is evident bilaterally.  On the right side no significant tenderness today but she does get occasional discomfort along the Achilles area.  Muscular strength 5/5 in all groups tested bilateral.  Gait: Unassisted, Nonantalgic.       Assessment:   Left Achilles tendinitis     Plan:  -Treatment options discussed including all alternatives, risks, and complications -Etiology of symptoms were discussed She recent x-rays and I can see the report.  There was no acute fracture or dislocation and no significant bone or joint pathology within the limitations of the exam noted.  This was on September 03, 2020.  I was unable to see the full images. -We discussed multiple treatment options.  This point when he can start with conservative treatment.  Prescribed a Medrol Dosepak.  Ice daily.  Dispensed a heel lift we discussed stretching, icing daily.  I referred her to physical therapy as well.  If no improvement MRI or if there is any worsening.  Vivi Barrack DPM

## 2020-10-10 ENCOUNTER — Ambulatory Visit: Payer: 59 | Attending: Podiatry

## 2020-10-15 ENCOUNTER — Ambulatory Visit: Payer: 59

## 2020-10-22 ENCOUNTER — Ambulatory Visit: Payer: 59

## 2020-10-29 ENCOUNTER — Ambulatory Visit: Payer: 59 | Admitting: Podiatry

## 2021-03-10 ENCOUNTER — Ambulatory Visit
Admission: EM | Admit: 2021-03-10 | Discharge: 2021-03-10 | Disposition: A | Discharge status: Home / Self Care | Payer: Managed Care, Other (non HMO) | Attending: Internal Medicine | Admitting: Internal Medicine

## 2021-03-10 ENCOUNTER — Other Ambulatory Visit: Payer: Self-pay

## 2021-03-10 DIAGNOSIS — Z113 Encounter for screening for infections with a predominantly sexual mode of transmission: Secondary | ICD-10-CM | POA: Insufficient documentation

## 2021-03-10 DIAGNOSIS — N898 Other specified noninflammatory disorders of vagina: Secondary | ICD-10-CM | POA: Diagnosis not present

## 2021-03-10 DIAGNOSIS — N3001 Acute cystitis with hematuria: Secondary | ICD-10-CM | POA: Insufficient documentation

## 2021-03-10 LAB — POCT URINALYSIS DIP (MANUAL ENTRY)
Bilirubin, UA: NEGATIVE
Glucose, UA: NEGATIVE mg/dL
Nitrite, UA: POSITIVE — AB
Protein Ur, POC: 30 mg/dL — AB
Spec Grav, UA: >=1.03 — AB (ref 1.010–1.025)
Urobilinogen, UA: 1 E.U./dL
pH, UA: 6 (ref 5.0–8.0)

## 2021-03-10 LAB — POCT URINE PREGNANCY: Preg Test, Ur: NEGATIVE

## 2021-03-10 MED ORDER — NITROFURANTOIN MONOHYD MACRO 100 MG PO CAPS: 100.0000 mg | ORAL_CAPSULE | Freq: Two times a day (BID) | ORAL | 0 refills | Status: DC

## 2021-03-10 NOTE — ED Provider Notes (Signed)
[facility]    CSN: [id number] Arrival date & time: 03/10/21  1413      History   Chief Complaint Chief Complaint  Patient presents with   Abdominal Pain   vaginal irritation    HPI MALIE KASHANI is a 31 y.o. female.   Patient presents with 7 to 8-day history of lower abdominal pain, vaginal irritation, vaginal itching.  Urinary frequency started approximately 3 days ago.  Patient denies urinary burning, vaginal discharge, hematuria, pelvic pain, fever, back pain, irregular vaginal bleeding.  Patient reports that she just stopped her last menstrual cycle a few days prior.  Denies any known exposure to STD but has had unprotected sexual intercourse recently.   Abdominal Pain  History reviewed. No pertinent past medical history.  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 No pertinent surgical history.  OB History   No obstetric history on file.      Home Medications    Prior to Admission medications   Medication Sig Start Date End Date Taking? Authorizing Provider  nitrofurantoin, macrocrystal-monohydrate, (MACROBID) 100 MG capsule Take 1 capsule (100 mg total) by mouth 2 (two) times daily. 03/10/21  Yes Eragon Hammond, Rolly Salter E, FNP  acetaminophen (TYLENOL) 500 MG tablet Take by mouth. 08/09/16   [provider]  azithromycin (ZITHROMAX) 500 MG tablet take 2 tablets by mouth ONCE FOR 1 DOSE 07/21/19   [provider]  benzonatate (TESSALON) 100  MG capsule Take by mouth. 12/12/15   [provider]  benzoyl peroxide-erythromycin (BENZAMYCIN) gel Apply topically 2 (two) times daily. to affected areas on face after washing and drying     [provider]  cetirizine (ZYRTEC) 10 MG tablet Take 1 tablet by mouth daily. 08/09/16   [provider]  citalopram (CELEXA) 10 MG tablet  05/17/19   [provider]  clindamycin (CLEOCIN) 300 MG capsule Take 300 mg by mouth 3 (three) times daily. 05/08/20   [provider]  CVS DICLOFENAC SODIUM 1 % GEL Apply 4 g topically 4 (four) times daily. 09/04/20   [provider]  dicyclomine (BENTYL) 20 MG tablet Take by mouth. 03/03/16   [provider]  Docusate Sodium (DSS) 100 MG CAPS Take by mouth. 03/22/16   [provider]  HYDROcodone-acetaminophen (NORCO/VICODIN) 5-325 MG tablet Take 1 tablet by mouth every 6 (six) hours as needed. 09/13/20   [provider]  HYDROcodone-homatropine (HYCODAN) 5-1.5 MG/5ML syrup Take 5 mLs by mouth every 6 (six) hours as needed for cough. 05/10/20   Arthor Captain, PA-C  ibuprofen (ADVIL,MOTRIN) 600 MG tablet Take 600 mg by mouth every 6 (six) hours as needed. for headache     [provider]  Levonorgestrel-Ethinyl Estradiol (AMETHIA) 0.15-0.03 &0.01 MG tablet Take 1 tablet by mouth daily.      [provider]  lidocaine (XYLOCAINE) 2 % solution Use as directed 10 mLs in the mouth or throat every 6 (six) hours as needed for mouth pain. 05/10/20   Arthor Captain, PA-C  medroxyPROGESTERone (PROVERA) 10 MG tablet Take by mouth. 09/06/15   [provider]  methylPREDNISolone (MEDROL DOSEPAK) 4 MG TBPK tablet Take as directed 09/30/20   Vivi Barrack, DPM  metroNIDAZOLE (FLAGYL) 500 MG tablet Take 500 mg by mouth 2 (two) times daily. 05/20/20   [provider]  naproxen (NAPROSYN) 375 MG tablet Take 1 tablet (375 mg total) by mouth 2 (two) times daily with a meal. 05/10/20    Harris, Abigail, PA-C  ondansetron (ZOFRAN-ODT) 4 MG disintegrating tablet Take by mouth. 03/03/16   [provider]  polyethylene glycol powder (GLYCOLAX/MIRALAX) 17 GM/SCOOP powder Take by mouth. 03/22/16   [provider]  predniSONE (DELTASONE) 20 MG tablet PLEASE SEE ATTACHED FOR DETAILED DIRECTIONS 05/08/20   [provider]  sulfamethoxazole-trimethoprim (BACTRIM DS) 800-160 MG tablet SMARTSIG:1 Tablet(s) By Mouth Every 12 Hours 05/20/20   [provider]  traZODone (DESYREL) 50 MG tablet Take by mouth.    [provider]  valACYclovir (VALTREX) 1000 MG tablet SMARTSIG:1 Tablet(s) By Mouth Every 12 Hours 05/20/20   [provider]    Family History Family History  Problem Relation Age of Onset   Hypertension Mother    Hypertension Father     Social History Social History   Tobacco Use   Smoking status: Every Day    Packs/day: 0.25    Types: Cigarettes   Smokeless tobacco: Never  Substance Use Topics   Alcohol use: Never   Drug use: Never     Allergies   Gramineae pollens   Review of Systems Review of Systems Per HPI  Physical Exam Triage Vital Signs ED Triage Vitals  Enc Vitals Group     BP 03/10/21 1532 118/81     Pulse Rate 03/10/21 1532 92     Resp 03/10/21 1532 18     Temp 03/10/21 1532 98.7 F (37.1 C)     Temp Source 03/10/21 1532 Oral     SpO2 03/10/21 1532 97 %     Weight --      Height --      Head Circumference --      Peak Flow --      Pain Score 03/10/21 1533 7     Pain Loc --      Pain Edu? --      Excl. in GC? --    No data found.  Updated Vital Signs BP 118/81 (BP Location: Left Arm)    Pulse 92    Temp 98.7 F (37.1 C) (Oral)    Resp 18    LMP 03/03/2021 (Exact Date)    SpO2 97%   Visual Acuity Right Eye Distance:   Left Eye Distance:   Bilateral Distance:    Right Eye Near:   Left Eye Near:    Bilateral Near:     Physical Exam Constitutional:      General: She is not in  acute distress.    Appearance: Normal appearance. She is not toxic-appearing or diaphoretic.  HENT:     Head: Normocephalic and atraumatic.  Eyes:     Extraocular Movements: Extraocular movements intact.     Conjunctiva/sclera: Conjunctivae normal.  Cardiovascular:     Rate and Rhythm: Normal rate and regular rhythm.     Pulses: Normal pulses.     Heart sounds: Normal heart sounds.  Pulmonary:     Effort: Pulmonary effort is normal. No respiratory distress.     Breath sounds: Normal breath sounds.  Abdominal:  General: Bowel sounds are normal. There is no distension.     Palpations: Abdomen is soft.     Tenderness: There is no abdominal tenderness.  Genitourinary:    Comments: Deferred with shared decision making. Self swab performed.  Neurological:     General: No focal deficit present.     Mental Status: She is alert and oriented to person, place, and time. Mental status is at baseline.  Psychiatric:        Mood and Affect: Mood normal.        Behavior: Behavior normal.        Thought Content: Thought content normal.        Judgment: Judgment normal.     UC Treatments / Results  Labs (all labs ordered are listed, but only abnormal results are displayed) Labs Reviewed  POCT URINALYSIS DIP (MANUAL ENTRY) - Abnormal; Notable for the following components:      Result Value   Clarity, UA cloudy (*)    Ketones, POC UA trace (5) (*)    Spec Grav, UA >=1.030 (*)    Blood, UA moderate (*)    Protein Ur, POC =30 (*)    Nitrite, UA Positive (*)    Leukocytes, UA Small (1+) (*)    All other components within normal limits  URINE CULTURE  POCT URINE PREGNANCY  CERVICOVAGINAL ANCILLARY ONLY    EKG   Radiology No results found.  Procedures Procedures (including critical care time)  Medications Ordered in UC Medications - No data to display  Initial Impression / Assessment and Plan / UC Course  I have reviewed the triage vital signs and the nursing  notes.  Pertinent labs & imaging results that were available during my care of the patient were reviewed by me and considered in my medical decision making (see chart for details).     Urinalysis indicating urinary tract infection.  Will treat with Macrobid.  Urine pregnancy negative.  Cervicovaginal swab pending due to vaginal itching and vaginal irritation to rule out other additional etiologies that could be coexisting.  Urine culture pending.  Discussed strict return precautions.  Patient verbalized understanding and was agreeable with plan. Final Clinical Impressions(s) / UC Diagnoses   Final diagnoses:  Acute cystitis with hematuria  Vaginal itching  Screening examination for venereal disease     Discharge Instructions      You have a urinary tract infection which is being treated with Macrobid antibiotic.  Your vaginal swab and urine culture pending.  We will call if there are any abnormalities.  Please increase water intake.    ED Prescriptions     Medication Sig Dispense Auth. Provider   nitrofurantoin, macrocrystal-monohydrate, (MACROBID) 100 MG capsule Take 1 capsule (100 mg total) by mouth 2 (two) times daily. 10 capsule Teodora Medici, [place]      PDMP not reviewed this encounter.   Teodora Medici, [place] 03/10/21 [phone]

## 2021-03-10 NOTE — ED Triage Notes (Signed)
7-8 days of abdominal pain and 4 days of vaginal irritation and itching. Three days of urinary frequency.  No meds taken. Pt reports that her MP just ended so she hasn't noticed any vaginal discharge. Denies vaginal odor.

## 2021-03-10 NOTE — Discharge Instructions (Signed)
You have a urinary tract infection which is being treated with Macrobid antibiotic.  Your vaginal swab and urine culture pending.  We will call if there are any abnormalities.  Please increase water intake.

## 2021-03-11 LAB — CERVICOVAGINAL ANCILLARY ONLY
Bacterial Vaginitis (gardnerella): POSITIVE — AB
Candida Glabrata: NEGATIVE
Candida Vaginitis: POSITIVE — AB
Chlamydia: POSITIVE — AB
Comment: NEGATIVE
Comment: NEGATIVE
Comment: NEGATIVE
Comment: NEGATIVE
Comment: NEGATIVE
Comment: NORMAL
Neisseria Gonorrhea: NEGATIVE
Trichomonas: NEGATIVE

## 2021-03-12 ENCOUNTER — Telehealth (HOSPITAL_COMMUNITY): Payer: Self-pay | Admitting: Emergency Medicine

## 2021-03-12 LAB — URINE CULTURE: Culture: >=100000 — AB

## 2021-03-12 MED ORDER — METRONIDAZOLE 500 MG PO TABS: 500.0000 mg | ORAL_TABLET | Freq: Two times a day (BID) | ORAL | 0 refills | Status: DC

## 2021-03-12 MED ORDER — FLUCONAZOLE 150 MG PO TABS: 150.0000 mg | ORAL_TABLET | Freq: Once | ORAL | 0 refills | Status: AC

## 2021-03-12 MED ORDER — DOXYCYCLINE HYCLATE 100 MG PO CAPS: 100.0000 mg | ORAL_CAPSULE | Freq: Two times a day (BID) | ORAL | 0 refills | Status: AC

## 2021-04-01 ENCOUNTER — Encounter (HOSPITAL_COMMUNITY): Payer: Self-pay

## 2021-04-01 ENCOUNTER — Other Ambulatory Visit: Payer: Self-pay

## 2021-04-01 ENCOUNTER — Ambulatory Visit
Admission: EM | Admit: 2021-04-01 | Discharge: 2021-04-01 | Disposition: A | Discharge status: Home / Self Care | Payer: 59 | Attending: Physician Assistant | Admitting: Physician Assistant

## 2021-04-01 ENCOUNTER — Emergency Department (HOSPITAL_COMMUNITY)
Admission: EM | Admit: 2021-04-01 | Discharge: 2021-04-01 | Disposition: A | Discharge status: Home / Self Care | Payer: 59 | Attending: Emergency Medicine | Admitting: Emergency Medicine

## 2021-04-01 DIAGNOSIS — G43909 Migraine, unspecified, not intractable, without status migrainosus: Secondary | ICD-10-CM | POA: Diagnosis present

## 2021-04-01 DIAGNOSIS — G43011 Migraine without aura, intractable, with status migrainosus: Secondary | ICD-10-CM | POA: Diagnosis not present

## 2021-04-01 DIAGNOSIS — Z5321 Procedure and treatment not carried out due to patient leaving prior to being seen by health care provider: Secondary | ICD-10-CM | POA: Diagnosis not present

## 2021-04-01 LAB — POCT URINE PREGNANCY: Preg Test, Ur: NEGATIVE

## 2021-04-01 MED ORDER — KETOROLAC TROMETHAMINE 60 MG/2ML IM SOLN
60.0000 mg | Freq: Once | INTRAMUSCULAR | Status: AC
  Administered 2021-04-01: 60 mg via INTRAMUSCULAR

## 2021-04-01 MED ORDER — METOCLOPRAMIDE HCL 5 MG/ML IJ SOLN
10.0000 mg | Freq: Once | INTRAMUSCULAR | Status: AC
  Administered 2021-04-01: 10 mg via INTRAMUSCULAR

## 2021-04-01 NOTE — ED Provider Triage Note (Signed)
Emergency Medicine Provider Triage Evaluation Note  Judith Estes , a 31 y.o. female  was evaluated in triage.  Pt complains of migraine headache for 4 days gradually worsening. History of migraine headaches. Photo and phonophobia. Nausea. No lightheadedness or blurry vision. No fever. Seen by telehealth and was put on a muscle relaxer, sumatriptan, neurotec, and effexor all without relief.   Review of Systems  Positive: Headache, photophobia, phonophobia, nausea  Negative: Vomiting, abdominal pain, chest pain, SOB, URI symptoms  Physical Exam  BP 129/71 (BP Location: Right Arm)    Pulse 86    Temp 98.8 F (37.1 C) (Oral)    Resp 18    LMP 03/03/2021 (Exact Date)    SpO2 100%  Gen:   Awake, no distress, on phone   Resp:  Normal effort  MSK:   Moves extremities without difficulty  Other:  EOMI, moving all extremities spontaneously  Medical Decision Making  Medically screening exam initiated at 12:35 PM.  Appropriate orders placed.  Judith Estes was informed that the remainder of the evaluation will be completed by another provider, this initial triage assessment does not replace that evaluation, and the importance of remaining in the ED until their evaluation is complete.  Labs ordered. Will defer any imaging till the patient is further evaluated in the back.    Achille Rich, PA-C 04/01/21 1239

## 2021-04-01 NOTE — ED Triage Notes (Signed)
Pt presents with c/o migraine/nausea for 4 days. Pt reports hx of same.

## 2021-04-01 NOTE — ED Provider Notes (Signed)
[facility]    CSN: [id number] Arrival date & time: 04/01/21  1407      History   Chief Complaint Chief Complaint  Patient presents with   Migraine    HPI LILYAHNA RISINGER is a 31 y.o. female.   Patient here concerned with migraine x 4 days.  Migraine worse than previous, 9/10 on pain scale. Located R side frontal region. She was at ED today but left AMA due to wait time.  She has taken Excedrin w/o relief. Admits photophobia, phonophobia, n/v.  Denies vision changes, n/t, weakness.  She states she saw PCP yesterday for same.   History reviewed. No pertinent past medical history.  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 No pertinent surgical history.  OB History   No obstetric history on file.      Home Medications    Prior to Admission medications   Medication Sig Start Date End Date Taking? Authorizing Provider  acetaminophen (TYLENOL) 500 MG tablet Take by mouth. 08/09/16   [provider]  benzonatate (TESSALON) 100 MG capsule Take by mouth. 12/12/15   [provider]  benzoyl peroxide-erythromycin (BENZAMYCIN) gel Apply topically 2 (two) times daily. to affected areas on face after washing and drying     [provider]  cetirizine (ZYRTEC) 10 MG tablet Take 1 tablet by mouth daily. 08/09/16   [provider]  citalopram (CELEXA) 10 MG tablet  05/17/19   [provider]  [employer] DICLOFENAC SODIUM 1 % GEL Apply  4 g topically 4 (four) times daily. 09/04/20   [provider]  dicyclomine (BENTYL) 20 MG tablet Take by mouth. 03/03/16   [provider]  Docusate Sodium (DSS) 100 MG CAPS Take by mouth. 03/22/16   [provider]  HYDROcodone-acetaminophen (NORCO/VICODIN) 5-325 MG tablet Take 1 tablet by mouth every 6 (six) hours as needed. 09/13/20   [provider]  HYDROcodone-homatropine (HYCODAN) 5-1.5 MG/5ML syrup Take 5 mLs by mouth every 6 (six) hours as needed for cough. 05/10/20   Margarita Mail, PA-C  ibuprofen (ADVIL,MOTRIN) 600 MG tablet Take 600 mg by mouth every 6 (six) hours as needed. for headache     [provider]  Levonorgestrel-Ethinyl Estradiol (AMETHIA) 0.15-0.03 &0.01 MG tablet Take 1 tablet by mouth daily.      [provider]  lidocaine (XYLOCAINE) 2 % solution Use as directed 10 mLs in the mouth or throat every 6 (six) hours as needed for mouth pain. 05/10/20   Harris, Vernie Shanks, PA-C  medroxyPROGESTERone (PROVERA) 10 MG tablet Take by mouth. 09/06/15   [provider]  methylPREDNISolone (MEDROL DOSEPAK) 4 MG TBPK tablet Take as directed 09/30/20   Trula Slade, DPM  metroNIDAZOLE (FLAGYL) 500 MG tablet Take 1 tablet (500 mg total) by mouth 2 (two) times daily. 03/12/21   Chase Picket, MD  naproxen (NAPROSYN) 375 MG tablet Take 1 tablet (375 mg total) by mouth 2 (two) times daily with a meal. 05/10/20   Margarita Mail,  PA-C  nitrofurantoin, macrocrystal-monohydrate, (MACROBID) 100 MG capsule Take 1 capsule (100 mg total) by mouth 2 (two) times daily. 03/10/21   Teodora Medici, FNP  ondansetron (ZOFRAN-ODT) 4 MG disintegrating tablet Take by mouth. 03/03/16   [provider]  polyethylene glycol powder (GLYCOLAX/MIRALAX) 17 GM/SCOOP powder Take by mouth. 03/22/16   [provider]  predniSONE (DELTASONE) 20 MG tablet PLEASE SEE ATTACHED FOR DETAILED DIRECTIONS 05/08/20   [provider]  traZODone (DESYREL)  50 MG tablet Take by mouth.    [provider]  valACYclovir (VALTREX) 1000 MG tablet SMARTSIG:1 Tablet(s) By Mouth Every 12 Hours 05/20/20   [provider]    Family History Family History  Problem Relation Age of Onset   Hypertension Mother    Hypertension Father     Social History Social History   Tobacco Use   Smoking status: Every Day    Packs/day: 0.25    Types: Cigarettes   Smokeless tobacco: Never  Substance Use Topics   Alcohol use: Never   Drug use: Never     Allergies   Gramineae pollens   Review of Systems Review of Systems  Constitutional:  Negative for chills, fatigue and fever.  Eyes:  Positive for photophobia. Negative for discharge, redness and visual disturbance.  Respiratory:  Negative for cough, shortness of breath and wheezing.   Cardiovascular:  Negative for chest pain, palpitations and leg swelling.  Gastrointestinal:  Positive for nausea and vomiting. Negative for abdominal pain and constipation.  Musculoskeletal:  Negative for arthralgias and myalgias.  Skin:  Negative for color change.  Neurological:  Positive for headaches. Negative for dizziness, syncope, speech difficulty, weakness, light-headedness and numbness.  Psychiatric/Behavioral:  Positive for sleep disturbance.     Physical Exam Triage Vital Signs ED Triage Vitals  Enc Vitals Group     BP 04/01/21 1423 126/84     Pulse Rate 04/01/21 1423 90     Resp 04/01/21 1423 20     Temp 04/01/21 1423 98.1 F (36.7 C)     Temp Source 04/01/21 1423 Oral     SpO2 04/01/21 1423 98 %     Weight --      Height --      Head Circumference --      Peak Flow --      Pain Score 04/01/21 1424 9     Pain Loc --      Pain Edu? --      Excl. in [place]? --    No data found.  Updated Vital Signs BP 126/84 (BP Location: Right Arm)    Pulse 90    Temp 98.1 F (36.7 C) (Oral)    Resp 20    LMP 03/03/2021 (Exact Date)    SpO2 98%   Visual Acuity Right Eye Distance:   Left Eye  Distance:   Bilateral Distance:    Right Eye Near:   Left Eye Near:    Bilateral Near:     Physical Exam Vitals and nursing note reviewed.  Constitutional:      General: She is not in acute distress.    Appearance: Normal appearance. She is not ill-appearing.  HENT:     Head: Normocephalic and atraumatic.  Eyes:     General: No scleral icterus.    Extraocular Movements: Extraocular movements intact.     Right eye: Normal extraocular motion and no nystagmus.     Left eye: Normal extraocular motion and no nystagmus.  Conjunctiva/sclera: Conjunctivae normal.     Pupils: Pupils are equal, round, and reactive to light.     Funduscopic exam:    Right eye: No papilledema.        Left eye: No papilledema.     Slit lamp exam:    Right eye: No photophobia.     Left eye: No photophobia.  Cardiovascular:     Rate and Rhythm: Normal rate and regular rhythm.     Heart sounds: No murmur heard. Pulmonary:     Effort: Pulmonary effort is normal. No respiratory distress.     Breath sounds: Normal breath sounds. No wheezing or rales.  Musculoskeletal:     Cervical back: Normal range of motion. No rigidity.  Skin:    Capillary Refill: Capillary refill takes less than 2 seconds.     Coloration: Skin is not jaundiced.     Findings: No rash.  Neurological:     General: No focal deficit present.     Mental Status: She is alert and oriented to person, place, and time.     Motor: No weakness.     Gait: Gait normal.  Psychiatric:        Mood and Affect: Mood normal.        Behavior: Behavior normal.     UC Treatments / Results  Labs (all labs ordered are listed, but only abnormal results are displayed) Labs Reviewed  POCT URINE PREGNANCY    EKG   Radiology No results found.  Procedures Procedures (including critical care time)  Medications Ordered in UC Medications  ketorolac (TORADOL) injection 60 mg (60 mg Intramuscular Given 04/01/21 1521)  metoCLOPramide (REGLAN)  injection 10 mg (10 mg Intramuscular Given 04/01/21 1521)    Initial Impression / Assessment and Plan / UC Course  I have reviewed the triage vital signs and the nursing notes.  Pertinent labs & imaging results that were available during my care of the patient were reviewed by me and considered in my medical decision making (see chart for details).     Patient reports some improvement in sx after treatment, nausea improved, HA improved, now 7/10 on pain scale I advised her to return to ED if sx no longer improve or if they worsen again Patient reports she will go home and rest, and if sx persist she will go to ED Final Clinical Impressions(s) / UC Diagnoses   Final diagnoses:  Intractable migraine without aura and with status migrainosus     Discharge Instructions      Go to ED if your symptoms fail to improve or with any new or worsening symptoms  Follow up with PCP     ED Prescriptions   None    PDMP not reviewed this encounter.   Peri Jefferson, PA-C 04/01/21 1542

## 2021-04-01 NOTE — Discharge Instructions (Addendum)
Go to ED if your symptoms fail to improve or with any new or worsening symptoms  Follow up with PCP

## 2021-04-01 NOTE — ED Notes (Signed)
Pt. Blood draw ws unsuccessful. Pt. Requested to hve an ultrasound IV by the nurse. Nurse aware.

## 2021-04-01 NOTE — ED Triage Notes (Signed)
Pt states she has had an migraine since Friday.

## 2021-04-29 ENCOUNTER — Ambulatory Visit
Admission: RE | Admit: 2021-04-29 | Discharge: 2021-04-29 | Disposition: A | Discharge status: Home / Self Care | Payer: 59 | Source: Ambulatory Visit | Attending: Physician Assistant | Admitting: Physician Assistant

## 2021-04-29 ENCOUNTER — Other Ambulatory Visit: Payer: Self-pay

## 2021-04-29 VITALS — BP 103/69 | HR 66 | Temp 97.9°F | Resp 14

## 2021-04-29 DIAGNOSIS — N760 Acute vaginitis: Secondary | ICD-10-CM | POA: Diagnosis not present

## 2021-04-29 DIAGNOSIS — Z113 Encounter for screening for infections with a predominantly sexual mode of transmission: Secondary | ICD-10-CM | POA: Diagnosis not present

## 2021-04-29 DIAGNOSIS — N898 Other specified noninflammatory disorders of vagina: Secondary | ICD-10-CM | POA: Insufficient documentation

## 2021-04-29 DIAGNOSIS — R110 Nausea: Secondary | ICD-10-CM | POA: Diagnosis not present

## 2021-04-29 DIAGNOSIS — B3731 Acute candidiasis of vulva and vagina: Secondary | ICD-10-CM | POA: Diagnosis not present

## 2021-04-29 DIAGNOSIS — R102 Pelvic and perineal pain: Secondary | ICD-10-CM | POA: Diagnosis not present

## 2021-04-29 LAB — POCT URINALYSIS DIP (MANUAL ENTRY)
Bilirubin, UA: NEGATIVE
Blood, UA: NEGATIVE
Glucose, UA: NEGATIVE mg/dL
Ketones, POC UA: NEGATIVE mg/dL
Leukocytes, UA: NEGATIVE
Nitrite, UA: NEGATIVE
Protein Ur, POC: NEGATIVE mg/dL
Spec Grav, UA: >=1.03 — AB (ref 1.010–1.025)
Urobilinogen, UA: 0.2 E.U./dL
pH, UA: 5.5 (ref 5.0–8.0)

## 2021-04-29 LAB — POCT URINE PREGNANCY: Preg Test, Ur: NEGATIVE

## 2021-04-29 MED ORDER — METRONIDAZOLE 500 MG PO TABS: 500.0000 mg | ORAL_TABLET | Freq: Two times a day (BID) | ORAL | 0 refills | Status: DC

## 2021-04-29 NOTE — ED Provider Notes (Signed)
[facility]    CSN: [id number] Arrival date & time: 04/29/21  1803      History   Chief Complaint Chief Complaint  Patient presents with   Appointment    1600   Vaginal Discharge    HPI Judith Estes is a 31 y.o. female.   Patient here today for evaluation of vaginal discomfort and discharge that feels similar to previous presentations of BV.  She has not had any fever.  She denies any other symptoms other than some mild nausea at times.  She denies any known STD exposures.  The history is provided by the patient.  Vaginal Discharge Associated symptoms: nausea   Associated symptoms: no abdominal pain, no fever and no vomiting    History reviewed. No pertinent past medical history.  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 No pertinent surgical history.  OB History   No obstetric history on file.      Home Medications    Prior to Admission medications   Medication Sig Start Date End Date Taking? Authorizing Provider  metroNIDAZOLE (FLAGYL) 500 MG tablet Take 1 tablet (500 mg total) by mouth 2 (two) times daily. 04/29/21  Yes Francene Finders, PA-C  acetaminophen (TYLENOL) 500 MG tablet Take by mouth. 08/09/16   [provider]  benzonatate (TESSALON) 100 MG capsule Take by mouth. 12/12/15   [provider]  benzoyl peroxide-erythromycin (BENZAMYCIN) gel Apply topically 2 (two) times daily. to affected areas on face after washing  and drying     [provider]  cetirizine (ZYRTEC) 10 MG tablet Take 1 tablet by mouth daily. 08/09/16   [provider]  citalopram (CELEXA) 10 MG tablet  05/17/19   [provider]  [employer] DICLOFENAC SODIUM 1 % GEL Apply 4 g topically 4 (four) times daily. 09/04/20   [provider]  dicyclomine (BENTYL) 20 MG tablet Take by mouth. 03/03/16   [provider]  Docusate Sodium (DSS) 100 MG CAPS Take by mouth. 03/22/16   [provider]  HYDROcodone-acetaminophen (NORCO/VICODIN) 5-325 MG tablet Take 1 tablet by mouth every 6 (six) hours as needed. 09/13/20   [provider]  HYDROcodone-homatropine (HYCODAN) 5-1.5 MG/5ML syrup Take 5 mLs by mouth every 6 (six) hours as needed for cough. 05/10/20   Margarita Mail, PA-C  ibuprofen (ADVIL,MOTRIN) 600 MG tablet Take 600 mg by mouth every 6 (six) hours as needed. for headache     [provider]  Levonorgestrel-Ethinyl Estradiol (AMETHIA) 0.15-0.03 &0.01 MG tablet Take 1 tablet by mouth daily.      [provider]  lidocaine (XYLOCAINE) 2 % solution Use as directed 10 mLs in the mouth or throat every 6 (six) hours as needed for mouth pain. 05/10/20   Harris, Vernie Shanks, PA-C  medroxyPROGESTERone (PROVERA) 10 MG tablet Take by mouth. 09/06/15   [provider]  methylPREDNISolone (MEDROL DOSEPAK) 4 MG TBPK tablet Take as directed 09/30/20   Trula Slade, DPM  naproxen (NAPROSYN) 375 MG tablet  Take 1 tablet (375 mg total) by mouth 2 (two) times daily with a meal. 05/10/20   Harris, Abigail, PA-C  nitrofurantoin, macrocrystal-monohydrate, (MACROBID) 100 MG capsule Take 1 capsule (100 mg total) by mouth 2 (two) times daily. Patient not taking: Reported on 04/29/2021 03/10/21   Teodora Medici, FNP  ondansetron (ZOFRAN-ODT) 4 MG disintegrating tablet Take by mouth. 03/03/16   [provider]  polyethylene glycol powder (GLYCOLAX/MIRALAX) 17 GM/SCOOP powder Take by mouth. 03/22/16    [provider]  predniSONE (DELTASONE) 20 MG tablet PLEASE SEE ATTACHED FOR DETAILED DIRECTIONS Patient not taking: Reported on 04/29/2021 05/08/20   [provider]  traZODone (DESYREL) 50 MG tablet Take by mouth.    [provider]  valACYclovir (VALTREX) 1000 MG tablet SMARTSIG:1 Tablet(s) By Mouth Every 12 Hours 05/20/20   [provider]    Family History Family History  Problem Relation Age of Onset   Hypertension Mother    Hypertension Father     Social History Social History   Tobacco Use   Smoking status: Every Day    Packs/day: 0.25    Types: Cigarettes   Smokeless tobacco: Never  Substance Use Topics   Alcohol use: Never   Drug use: Never     Allergies   Gramineae pollens   Review of Systems Review of Systems  Constitutional:  Negative for chills and fever.  Eyes:  Negative for discharge and redness.  Gastrointestinal:  Positive for nausea. Negative for abdominal pain and vomiting.  Genitourinary:  Positive for pelvic pain and vaginal discharge.    Physical Exam Triage Vital Signs ED Triage Vitals [04/29/21 1907]  Enc Vitals Group     BP      Pulse      Resp      Temp      Temp src      SpO2      Weight      Height      Head Circumference      Peak Flow      Pain Score 4     Pain Loc      Pain Edu?      Excl. in [place]?    No data found.  Updated Vital Signs BP 103/69 (BP Location: Left Arm)    Pulse 66    Temp 97.9 F (36.6 C) (Oral)    Resp 14    SpO2 96%      Physical Exam Vitals and nursing note reviewed.  Constitutional:      General: She is not in acute distress.    Appearance: Normal appearance. She is not ill-appearing.  HENT:     Head: Normocephalic and atraumatic.  Eyes:     Conjunctiva/sclera: Conjunctivae normal.  Cardiovascular:     Rate and Rhythm: Normal rate.  Pulmonary:     Effort: Pulmonary effort is normal.  Neurological:     Mental Status: She is alert.  Psychiatric:         Mood and Affect: Mood normal.        Behavior: Behavior normal.        Thought Content: Thought content normal.     UC Treatments / Results  Labs (all labs ordered are listed, but only abnormal results are displayed) Labs Reviewed  POCT URINALYSIS DIP (MANUAL ENTRY) - Abnormal; Notable for the following components:      Result Value   Spec Grav, UA >=1.030 (*)    All other components within normal  limits  POCT URINE PREGNANCY  CERVICOVAGINAL ANCILLARY ONLY    EKG   Radiology No results found.  Procedures Procedures (including critical care time)  Medications Ordered in UC Medications - No data to display  Initial Impression / Assessment and Plan / UC Course  I have reviewed the triage vital signs and the nursing notes.  Pertinent labs & imaging results that were available during my care of the patient were reviewed by me and considered in my medical decision making (see chart for details).  Metronidazole prescribed for BV.  STD screening ordered.  Will await results further recommendation.  Encouraged follow-up with any further concerns.   Final Clinical Impressions(s) / UC Diagnoses   Final diagnoses:  Vaginal discharge   Discharge Instructions   None    ED Prescriptions     Medication Sig Dispense Auth. Provider   metroNIDAZOLE (FLAGYL) 500 MG tablet Take 1 tablet (500 mg total) by mouth 2 (two) times daily. 14 tablet Francene Finders, PA-C      PDMP not reviewed this encounter.   Francene Finders, PA-C 04/29/21 1925

## 2021-04-29 NOTE — ED Triage Notes (Signed)
Pt sts vaginal discomfort and some discharge; pt sts feels similar to when she has had BV in past

## 2021-04-30 ENCOUNTER — Ambulatory Visit (INDEPENDENT_AMBULATORY_CARE_PROVIDER_SITE_OTHER): Payer: 59 | Admitting: Psychiatry

## 2021-04-30 ENCOUNTER — Encounter: Payer: Self-pay | Admitting: Psychiatry

## 2021-04-30 VITALS — BP 108/74 | HR 61 | Ht 66.0 in | Wt 305.8 lb

## 2021-04-30 DIAGNOSIS — G43719 Chronic migraine without aura, intractable, without status migrainosus: Secondary | ICD-10-CM | POA: Diagnosis not present

## 2021-04-30 MED ORDER — KETOROLAC TROMETHAMINE 10 MG PO TABS: 10.0000 mg | ORAL_TABLET | Freq: Three times a day (TID) | ORAL | 0 refills | Status: AC

## 2021-04-30 MED ORDER — KETOROLAC TROMETHAMINE 60 MG/2ML IM SOLN
60.0000 mg | Freq: Once | INTRAMUSCULAR | Status: AC
  Administered 2021-04-30: 60 mg via INTRAMUSCULAR

## 2021-04-30 NOTE — Progress Notes (Signed)
? ?Referring:  ?Lewis Moccasin, MD ?[address] ?[place],  [place] [address] ? ?PCP: ?Patient, No Pcp Per (Inactive) ? ?Neurology was asked to evaluate Judith Estes, a 31 year old female for a chief complaint of headaches.  Our recommendations of care will be communicated by shared medical record.   ? ?CC:  headaches ? ?HPI:  ?Medical co-morbidities: none ? ?The patient presents for evaluation of migraines. She has had migraines for several years, but then worsened in October 2023. Now they are almost every day. Headaches are described as left temporal throbbing with associated photophobia, phonophobia, and nausea. She will sometimes see flashing lights before her migraines. They can last up to 24 hours at a time. ? ?She just started propranolol 80 mg daily 2 days ago. Was also prescribed Maxalt 5 mg PRN but has not started it yet. ? ?Headache History: ?Onset: October 2023 ?Triggers: no ?Aura: flashing lights ?Location: left temple ?Quality/Description: throbbing ?Associated Symptoms: ? Photophobia: yes ? Phonophobia: yes ? Nausea: yes ?Vomiting: rarely ?Worse with activity?: yes ?Duration of headaches: up to 24 hours ? ?Headache days per month: 30 ?Headache free days per month: 0 ? ?Current Treatment: ?Abortive ?Maxalt 5 mg PRN ? ?Preventative ?Propranolol 80 mg daily ? ?Prior Therapies                                 ?Zofran ?Celexa 10 mg daily ?Effexor 150 mg daily ?Topamax ?Imitrex ?Nurtec ? ?Headache Risk Factors: ?Headache risk factors and/or co-morbidities ?(-) Neck Pain ?(+) Sleep Disorder - insomnia, takes trazodone for sleep ?(+) Obesity  Body mass index is 49.36 kg/m?. ? ?LABS: ?CBC ?   ?Component Value Date/Time  ? WBC 10.0 05/10/2020 2012  ? RBC 4.54 05/10/2020 2012  ? HGB 14.0 05/10/2020 2012  ? HCT 42.3 05/10/2020 2012  ? PLT 246 05/10/2020 2012  ? MCV 93.2 05/10/2020 2012  ? MCH 30.8 05/10/2020 2012  ? MCHC 33.1 05/10/2020 2012  ? RDW 13.7 05/10/2020 2012  ? LYMPHSABS 2.7 11/23/2009 0958  ?  MONOABS 0.7 11/23/2009 0958  ? EOSABS 0.1 11/23/2009 0958  ? BASOSABS 0.0 11/23/2009 0958  ? ?CMP Latest Ref Rng & Units 05/10/2020 11/23/2009 01/04/2009  ?Glucose 70 - 99 mg/dL 89 93 94  ?BUN 6 - 20 mg/dL 9 7 8   ?Creatinine 0.44 - 1.00 mg/dL 0.38 3.33 8.32  ?Sodium 135 - 145 mmol/L 135 140 140  ?Potassium 3.5 - 5.1 mmol/L 3.9 4.2 4.4  ?Chloride 98 - 111 mmol/L 103 107 106  ?CO2 22 - 32 mmol/L 20(L) 26 23  ?Calcium 8.9 - 10.3 mg/dL 9.1(B) 9.6 9.5  ?Total Protein 6.0 - 8.3 g/dL - 7.7 -  ?Total Bilirubin 0.3 - 1.2 mg/dL - 0.4 -  ?Alkaline Phos 39 - 117 U/L - 60 -  ?AST 0 - 37 U/L - 15 -  ?ALT 0 - 35 U/L - 11 -  ? ? ? ?IMAGING:  ?CTH 2010: unremarkable ? ?Imaging independently reviewed on April 30, 2021  ? ?Current Outpatient Medications on File Prior to Visit  ?Medication Sig Dispense Refill  ? acetaminophen (TYLENOL) 500 MG tablet Take by mouth.    ? albuterol (VENTOLIN HFA) 108 (90 Base) MCG/ACT inhaler Inhale 2 puffs into the lungs 4 (four) times daily. As needed    ? Cholecalciferol (VITAMIN D3) 1.25 MG (50000 UT) CAPS Take 1 capsule by mouth once a week.    ? dicyclomine (  BENTYL) 20 MG tablet Take by mouth.    ? Docusate Sodium (DSS) 100 MG CAPS Take by mouth.    ? ferrous sulfate 325 (65 FE) MG tablet Take 325 mg by mouth 2 (two) times daily.    ? metroNIDAZOLE (FLAGYL) 500 MG tablet Take 1 tablet (500 mg total) by mouth 2 (two) times daily. 14 tablet 0  ? ondansetron (ZOFRAN-ODT) 4 MG disintegrating tablet Take by mouth.    ? Prenatal Vit-Fe Fumarate-FA (M-NATAL PLUS) 27-1 MG TABS Take 1 tablet by mouth daily.    ? propranolol ER (INDERAL LA) 80 MG 24 hr capsule Take 80 mg by mouth at bedtime.    ? rizatriptan (MAXALT) 5 MG tablet Take by mouth.    ? traZODone (DESYREL) 50 MG tablet Take by mouth.    ? valACYclovir (VALTREX) 1000 MG tablet SMARTSIG:1 Tablet(s) By Mouth Every 12 Hours    ? venlafaxine XR (EFFEXOR-XR) 150 MG 24 hr capsule Take 150 mg by mouth daily.    ? citalopram (CELEXA) 10 MG tablet  (Patient not  taking: Reported on 04/30/2021)    ? Levonorgestrel-Ethinyl Estradiol (AMETHIA) 0.15-0.03 &0.01 MG tablet Take 1 tablet by mouth daily.   (Patient not taking: Reported on 04/30/2021)    ? medroxyPROGESTERone (PROVERA) 10 MG tablet Take by mouth. (Patient not taking: Reported on 04/30/2021)    ? ?No current facility-administered medications on file prior to visit.  ? ? ? ?Allergies: ?Allergies  ?Allergen Reactions  ? Gramineae Pollens Itching  ? ? ?Family History: ?Family History  ?Problem Relation Age of Onset  ? Hypertension Mother   ? Other Mother   ? Other Father   ? Hypertension Father   ? Other Brother   ? ? ? ?Past Medical History: ?Past Medical History:  ?Diagnosis Date  ? Anemia   ? Headache   ? ? ?Past Surgical History ?Past Surgical History:  ?Procedure Laterality Date  ? ACHILLES TENDON REPAIR Right   ? APPENDECTOMY    ? GASTRIC BYPASS    ? TYMPANOSTOMY TUBE PLACEMENT    ? ? ?Social History: ?Social History  ? ?Tobacco Use  ? Smoking status: Every Day  ?  Packs/day: 0.25  ?  Types: Cigarettes  ? Smokeless tobacco: Never  ? Tobacco comments:  ?  04/30/21 trying to quit  ?Substance Use Topics  ? Alcohol use: Never  ? Drug use: Never  ? ? ?ROS: ?Negative for fevers, chills. Positive for headaches. All other systems reviewed and negative unless stated otherwise in HPI. ? ? ?Physical Exam:  ? ?Vital Signs: ?BP 108/74   Pulse 61   Ht 5\' 6"  (1.676 m)   Wt (!) 305 lb 12.8 oz (138.7 kg)   BMI 49.36 kg/m?  ?GENERAL: well appearing,in no acute distress,alert ?SKIN:  Color, texture, turgor normal. No rashes or lesions ?HEAD:  Normocephalic/atraumatic. ?CV:  RRR ?RESP: Normal respiratory effort ?MSK: +tenderness to palpation over bilateral occiput, neck, and shoulders ? ?NEUROLOGICAL: ?Mental Status: Alert, oriented to person, place and time,Follows commands ?Cranial Nerves: PERRL, visual fields intact to confrontation, extraocular movements intact, facial sensation intact, no facial droop or ptosis, hearing grossly  intact, no dysarthria ?Motor: muscle strength 5/5 both upper and lower extremities,no drift, normal tone ?Reflexes: 2+ throughout ?Sensation: intact to light touch all 4 extremities ?Coordination: Finger-to- nose-finger intact bilaterally ?Gait: normal-based ? ? ?IMPRESSION: ?31 year old female who presents for evaluation of chronic migraines. She just recently started propranolol for prevention a couple of days ago.  Discussed how it can take 4-6 weeks for preventive medications to take effect. Will continue propranolol for now. She has not yet taken Maxalt. Encouraged her to try Maxalt for her next severe headache. Will give Toradol shot today to try to provide some relief while waiting for medications to take effect. ? ?PLAN: ?-Toradol shot today, then oral Toradol 10 mg TID x 4 days ?-Prevention: Continue propranolol 80 mg daily ?-Rescue: Continue Maxalt 5 mg PRN ?-next steps: consider CGRP, Botox for prevention ? ?I spent a total of 26 minutes chart reviewing and counseling the patient. Headache education was done. Discussed treatment options including preventive and acute medications. Discussed medication side effects, adverse reactions and drug interactions. Written educational materials and patient instructions outlining all of the above were given. ? ?Follow-up: 3-4 months ? ? ?Ocie Doyne, MD ?04/30/2021   ?3:45 PM ? ? ?

## 2021-04-30 NOTE — Patient Instructions (Signed)
Continue propranolol 80 mg daily for headache prevention ?Continue Maxalt as needed for migraines ?Toradol shot today ?

## 2021-05-02 ENCOUNTER — Telehealth: Payer: Self-pay

## 2021-05-02 LAB — CERVICOVAGINAL ANCILLARY ONLY
Bacterial Vaginitis (gardnerella): POSITIVE — AB
Candida Glabrata: NEGATIVE
Candida Vaginitis: POSITIVE — AB
Chlamydia: NEGATIVE
Comment: NEGATIVE
Comment: NEGATIVE
Comment: NEGATIVE
Comment: NEGATIVE
Comment: NEGATIVE
Comment: NORMAL
Neisseria Gonorrhea: NEGATIVE
Trichomonas: NEGATIVE

## 2021-05-02 MED ORDER — FLUCONAZOLE 150 MG PO TABS: 150.0000 mg | ORAL_TABLET | Freq: Once | ORAL | 0 refills | Status: AC

## 2021-05-19 ENCOUNTER — Ambulatory Visit
Admission: RE | Admit: 2021-05-19 | Discharge: 2021-05-19 | Disposition: A | Discharge status: Home / Self Care | Payer: 59 | Source: Ambulatory Visit | Attending: Internal Medicine | Admitting: Internal Medicine

## 2021-05-19 ENCOUNTER — Other Ambulatory Visit: Payer: Self-pay

## 2021-05-19 VITALS — BP 106/72 | HR 80 | Temp 97.9°F | Resp 16

## 2021-05-19 DIAGNOSIS — J069 Acute upper respiratory infection, unspecified: Secondary | ICD-10-CM | POA: Insufficient documentation

## 2021-05-19 DIAGNOSIS — J029 Acute pharyngitis, unspecified: Secondary | ICD-10-CM | POA: Diagnosis present

## 2021-05-19 LAB — POCT RAPID STREP A (OFFICE): Rapid Strep A Screen: NEGATIVE

## 2021-05-19 MED ORDER — FLUTICASONE PROPIONATE 50 MCG/ACT NA SUSP
1.0000 | Freq: Every day | NASAL | 0 refills | Status: AC
Start: 2021-05-19 — End: 2021-05-22

## 2021-05-19 MED ORDER — BENZONATATE 100 MG PO CAPS: 100.0000 mg | ORAL_CAPSULE | Freq: Three times a day (TID) | ORAL | 0 refills | Status: DC | PRN

## 2021-05-19 NOTE — Discharge Instructions (Signed)
It appears that you have a viral upper respiratory infection that should run its course and self resolve in the next few days with symptomatic treatment.  Your rapid strep was negative.  Throat culture, COVID-19, flu test is pending.  We will call if it is positive. ?

## 2021-05-19 NOTE — ED Triage Notes (Signed)
Sore throat, cough, congestion starting yesterday. ?

## 2021-05-19 NOTE — ED Provider Notes (Signed)
?[facility] ? ? ? ?CSN: [id number] ?Arrival date & time: 05/19/21  1408 ? ? ?  ? ?History   ?Chief Complaint ?Chief Complaint  ?Patient presents with  ? Appointment 2 pm  ? ? ?HPI ?Judith Estes is a 31 y.o. female.  ? ?Patient presents with sore throat, cough, nasal congestion that started yesterday.  Denies any known fevers or sick contacts.  Denies chest pain, shortness of breath, ear pain, nausea, vomiting, diarrhea, abdominal pain.  Patient has not taken any medications to alleviate symptoms. ? ? ? ?Past Medical History:  ?Diagnosis Date  ? Anemia   ? Headache   ? ? ?Patient Active Problem List  ? Diagnosis Date Noted  ? Acute appendicitis 03/21/2016  ? Insomnia 09/30/2015  ? Sleep apnea 09/30/2015  ? Light and infrequent menstruation 05/05/2013  ? Migraine with aura 05/05/2013  ? UPPER RESPIRATORY INFECTION, ACUTE 01/04/2009  ? ELEVATED BLOOD PRESSURE WITHOUT DIAGNOSIS OF HYPERTENSION 01/04/2009  ? 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 CONTUSION OF KNEE 08/06/2008  ? ALLERGIC RHINITIS, SEASONAL 06/20/2008  ? SHOULDER PAIN, RIGHT 06/20/2008  ? Headache(784.0) 06/20/2008  ? OBESITY, NOS 04/29/2006  ? ? ?Past Surgical History:  ?Procedure Laterality Date  ? ACHILLES TENDON REPAIR Right   ? APPENDECTOMY    ? GASTRIC BYPASS    ? TYMPANOSTOMY TUBE PLACEMENT    ? ? ?OB History   ?No obstetric history on file. ?  ? ? ? ?Home Medications   ? ?Prior to Admission medications   ?Medication Sig Start Date End Date Taking? Authorizing Provider  ?benzonatate (TESSALON) 100 MG capsule Take 1 capsule (100 mg total) by mouth every 8 (eight) hours as needed for cough. 05/19/21  Yes Gustavus Bryant, FNP  ?fluticasone (FLONASE) 50 MCG/ACT nasal spray Place 1 spray into both nostrils daily for 3 days. 05/19/21 05/22/21 Yes Gustavus Bryant, FNP  ?acetaminophen (TYLENOL) 500 MG tablet Take by mouth. 08/09/16   [provider]  ?albuterol (VENTOLIN HFA) 108 (90 Base) MCG/ACT inhaler Inhale 2 puffs into the lungs 4  (four) times daily. As needed 11/26/20   [provider]  ?Cholecalciferol (VITAMIN D3) 1.25 MG (50000 UT) CAPS Take 1 capsule by mouth once a week. 02/13/21   [provider]  ?citalopram (CELEXA) 10 MG tablet  05/17/19   [provider]  ?dicyclomine (BENTYL) 20 MG tablet Take by mouth. 03/03/16   [provider]  ?Docusate Sodium (DSS) 100 MG CAPS Take by mouth. 03/22/16   [provider]  ?ferrous sulfate 325 (65 FE) MG tablet Take 325 mg by mouth 2 (two) times daily. 02/13/21   [provider]  ?Levonorgestrel-Ethinyl Estradiol (AMETHIA) 0.15-0.03 &0.01 MG tablet Take 1 tablet by mouth daily.   ?Patient not taking: Reported on 04/30/2021    [provider]  ?medroxyPROGESTERone (PROVERA) 10 MG tablet Take by mouth. ?Patient not taking: Reported on 04/30/2021 09/06/15   [provider]  ?metroNIDAZOLE (FLAGYL) 500 MG tablet Take 1 tablet (500 mg total) by mouth 2 (two) times daily. 04/29/21   Tomi Bamberger, PA-C  ?ondansetron (ZOFRAN-ODT) 4 MG disintegrating tablet Take by mouth. 03/03/16   [provider]  ?Prenatal Vit-Fe Fumarate-FA (M-NATAL PLUS) 27-1 MG TABS Take 1 tablet by mouth daily. 02/27/21   [provider]  ?propranolol ER (INDERAL LA) 80 MG 24 hr capsule Take 80 mg by mouth at bedtime. 04/24/21   [provider]  ?rizatriptan (MAXALT) 5 MG tablet Take by mouth.  04/25/21   [provider]  ?traZODone (DESYREL) 50 MG tablet Take by mouth.    [provider]  ?valACYclovir (VALTREX) 1000 MG tablet SMARTSIG:1 Tablet(s) By Mouth Every 12 Hours 05/20/20   [provider]  ?venlafaxine XR (EFFEXOR-XR) 150 MG 24 hr capsule Take 150 mg by mouth daily. 02/27/21   [provider]  ? ? ?Family History ?Family History  ?Problem Relation Age of Onset  ? Hypertension Mother   ? Other Mother   ? Other Father   ? Hypertension Father   ? Other Brother   ? ? ?Social History ?Social History   ? ?Tobacco Use  ? Smoking status: Every Day  ?  Packs/day: 0.25  ?  Types: Cigarettes  ? Smokeless tobacco: Never  ? Tobacco comments:  ?  04/30/21 trying to quit  ?Substance Use Topics  ? Alcohol use: Never  ? Drug use: Never  ? ? ? ?Allergies   ?Gramineae pollens ? ? ?Review of Systems ?Review of Systems ?Per HPI ? ?Physical Exam ?Triage Vital Signs ?ED Triage Vitals  ?Enc Vitals Group  ?   BP 05/19/21 1421 106/72  ?   Pulse Rate 05/19/21 1421 80  ?   Resp 05/19/21 1421 16  ?   Temp 05/19/21 1421 97.9 ?F (36.6 ?C)  ?   Temp Source 05/19/21 1421 Oral  ?   SpO2 05/19/21 1421 98 %  ?   Weight --   ?   Height --   ?   Head Circumference --   ?   Peak Flow --   ?   Pain Score 05/19/21 1423 8  ?   Pain Loc --   ?   Pain Edu? --   ?   Excl. in GC? --   ? ?No data found. ? ?Updated Vital Signs ?BP 106/72 (BP Location: Left Arm)   Pulse 80   Temp 97.9 ?F (36.6 ?C) (Oral)   Resp 16   SpO2 98%  ? ?Visual Acuity ?Right Eye Distance:   ?Left Eye Distance:   ?Bilateral Distance:   ? ?Right Eye Near:   ?Left Eye Near:    ?Bilateral Near:    ? ?Physical Exam ?Constitutional:   ?   General: She is not in acute distress. ?   Appearance: Normal appearance. She is not toxic-appearing or diaphoretic.  ?HENT:  ?   Head: Normocephalic and atraumatic.  ?   Right Ear: Tympanic membrane and ear canal normal.  ?   Left Ear: Tympanic membrane and ear canal normal.  ?   Nose: Congestion present.  ?   Mouth/Throat:  ?   Mouth: Mucous membranes are moist.  ?   Pharynx: Posterior oropharyngeal erythema present.  ?Eyes:  ?   Extraocular Movements: Extraocular movements intact.  ?   Conjunctiva/sclera: Conjunctivae normal.  ?   Pupils: Pupils are equal, round, and reactive to light.  ?Cardiovascular:  ?   Rate and Rhythm: Normal rate and regular rhythm.  ?   Pulses: Normal pulses.  ?   Heart sounds: Normal heart sounds.  ?Pulmonary:  ?   Effort: Pulmonary effort is normal. No respiratory distress.  ?   Breath sounds: Normal breath sounds. No  stridor. No wheezing, rhonchi or rales.  ?Abdominal:  ?   General: Abdomen is flat. Bowel sounds are normal.  ?   Palpations: Abdomen is soft.  ?Musculoskeletal:     ?   General: Normal range of motion.  ?  Cervical back: Normal range of motion.  ?Skin: ?   General: Skin is warm and dry.  ?Neurological:  ?   General: No focal deficit present.  ?   Mental Status: She is alert and oriented to person, place, and time. Mental status is at baseline.  ?Psychiatric:     ?   Mood and Affect: Mood normal.     ?   Behavior: Behavior normal.  ? ? ? ?UC Treatments / Results  ?Labs ?(all labs ordered are listed, but only abnormal results are displayed) ?Labs Reviewed  ?COVID-19, FLU A+B NAA  ?CULTURE, GROUP A STREP [facility])  ?POCT RAPID STREP A (OFFICE)  ? ? ?EKG ? ? ?Radiology ?No results found. ? ?Procedures ?Procedures (including critical care time) ? ?Medications Ordered in UC ?Medications - No data to display ? ?Initial Impression / Assessment and Plan / UC Course  ?I have reviewed the triage vital signs and the nursing notes. ? ?Pertinent labs & imaging results that were available during my care of the patient were reviewed by me and considered in my medical decision making (see chart for details). ? ?  ? ?Patient presents with symptoms likely from a viral upper respiratory infection. Differential includes bacterial pneumonia, sinusitis, allergic rhinitis, COVID-19, flu. Do not suspect underlying cardiopulmonary process. Symptoms seem unlikely related to ACS, CHF or COPD exacerbations, pneumonia, pneumothorax. Patient is nontoxic appearing and not in need of emergent medical intervention.  Rapid strep was negative.  Throat culture, COVID-19, flu test pending. ? ?Recommended symptom control with over the counter medications: Daily oral anti-histamine, Oral decongestant or IN corticosteroid, saline irrigations, cepacol lozenges, Robitussin, Delsym, honey tea.  Patient sent prescriptions. ? ?Return if symptoms fail to improve  in 1-2 weeks or you develop shortness of breath, chest pain, severe headache. Patient states understanding and is agreeable. ? ?Discharged with PCP followup.  ?Final Clinical Impressions(s) / UC Diagnoses  ? ?Final d

## 2021-05-20 ENCOUNTER — Ambulatory Visit: Payer: 59 | Admitting: Psychiatry

## 2021-05-20 LAB — COVID-19, FLU A+B NAA
Influenza A, NAA: NOT DETECTED
Influenza B, NAA: NOT DETECTED
SARS-CoV-2, NAA: NOT DETECTED

## 2021-05-21 ENCOUNTER — Other Ambulatory Visit: Payer: Self-pay

## 2021-05-21 ENCOUNTER — Ambulatory Visit
Admission: RE | Admit: 2021-05-21 | Discharge: 2021-05-21 | Disposition: A | Discharge status: Home / Self Care | Payer: 59 | Source: Ambulatory Visit | Attending: Physician Assistant | Admitting: Physician Assistant

## 2021-05-21 VITALS — BP 121/74 | HR 73 | Temp 98.0°F | Resp 18

## 2021-05-21 DIAGNOSIS — J029 Acute pharyngitis, unspecified: Secondary | ICD-10-CM

## 2021-05-21 LAB — CULTURE, GROUP A STREP (THRC)

## 2021-05-21 MED ORDER — PROMETHAZINE-DM 6.25-15 MG/5ML PO SYRP: 5.0000 mL | ORAL_SOLUTION | Freq: Four times a day (QID) | ORAL | 0 refills | Status: DC | PRN

## 2021-05-21 MED ORDER — AMOXICILLIN 500 MG PO CAPS: 500.0000 mg | ORAL_CAPSULE | Freq: Three times a day (TID) | ORAL | 0 refills | Status: DC

## 2021-05-21 NOTE — ED Provider Notes (Signed)
?[facility] ? ? ? ?CSN: [id number] ?Arrival date & time: 05/21/21  1502 ? ? ?  ? ?History   ?Chief Complaint ?Chief Complaint  ?Patient presents with  ? Cough  ?  Congestion, sore throat, chest pain - Entered by patient  ? Sore Throat  ? ? ?HPI ?Judith Estes is a 31 y.o. female.  ? ?Patient here today for evaluation of cough, congestion and sore throat that started about 4 days ago.  She reports that she was seen in office and was prescribed Tessalon Perles and Flonase but symptoms have not improved.  Her culture came back positive for non-group A strep today.  She does not report fever. ? ?The history is provided by the patient.  ?Cough ?Associated symptoms: sore throat   ?Associated symptoms: no chills, no eye discharge and no fever   ?Sore Throat ? ? ?Past Medical History:  ?Diagnosis Date  ? Anemia   ? Headache   ? ? ?Patient Active Problem List  ? Diagnosis Date Noted  ? Acute appendicitis 03/21/2016  ? Insomnia 09/30/2015  ? Sleep apnea 09/30/2015  ? Light and infrequent menstruation 05/05/2013  ? Migraine with aura 05/05/2013  ? UPPER RESPIRATORY INFECTION, ACUTE 01/04/2009  ? ELEVATED BLOOD PRESSURE WITHOUT DIAGNOSIS OF HYPERTENSION 01/04/2009  ? 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 CONTUSION OF KNEE 08/06/2008  ? ALLERGIC RHINITIS, SEASONAL 06/20/2008  ? SHOULDER PAIN, RIGHT 06/20/2008  ? Headache(784.0) 06/20/2008  ? OBESITY, NOS 04/29/2006  ? ? ?Past Surgical History:  ?Procedure Laterality Date  ? ACHILLES TENDON REPAIR Right   ? APPENDECTOMY    ? GASTRIC BYPASS    ? TYMPANOSTOMY TUBE PLACEMENT    ? ? ?OB History   ?No obstetric history on file. ?  ? ? ? ?Home Medications   ? ?Prior to Admission medications   ?Medication Sig Start Date End Date Taking? Authorizing Provider  ?amoxicillin (AMOXIL) 500 MG capsule Take 1 capsule (500 mg total) by mouth 3 (three) times daily. 05/21/21  Yes Tomi Bamberger, PA-C  ?promethazine-dextromethorphan (PROMETHAZINE-DM) 6.25-15 MG/5ML syrup Take 5 mLs  by mouth 4 (four) times daily as needed for cough. 05/21/21  Yes Tomi Bamberger, PA-C  ?acetaminophen (TYLENOL) 500 MG tablet Take by mouth. 08/09/16   [provider]  ?albuterol (VENTOLIN HFA) 108 (90 Base) MCG/ACT inhaler Inhale 2 puffs into the lungs 4 (four) times daily. As needed 11/26/20   [provider]  ?benzonatate (TESSALON) 100 MG capsule Take 1 capsule (100 mg total) by mouth every 8 (eight) hours as needed for cough. 05/19/21   Gustavus Bryant, FNP  ?Cholecalciferol (VITAMIN D3) 1.25 MG (50000 UT) CAPS Take 1 capsule by mouth once a week. 02/13/21   [provider]  ?citalopram (CELEXA) 10 MG tablet  05/17/19   [provider]  ?dicyclomine (BENTYL) 20 MG tablet Take by mouth. 03/03/16   [provider]  ?Docusate Sodium (DSS) 100 MG CAPS Take by mouth. 03/22/16   [provider]  ?ferrous sulfate 325 (65 FE) MG tablet Take 325 mg by mouth 2 (two) times daily. 02/13/21   [provider]  ?fluticasone (FLONASE) 50 MCG/ACT nasal spray Place 1 spray into both nostrils daily for 3 days. 05/19/21 05/22/21  Gustavus Bryant, FNP  ?Levonorgestrel-Ethinyl Estradiol (AMETHIA) 0.15-0.03 &0.01 MG tablet Take 1 tablet by mouth daily.   ?Patient not taking: Reported on 04/30/2021    [provider]  ?medroxyPROGESTERone (PROVERA) 10 MG tablet Take by mouth. ?  Patient not taking: Reported on 04/30/2021 09/06/15   [provider]  ?metroNIDAZOLE (FLAGYL) 500 MG tablet Take 1 tablet (500 mg total) by mouth 2 (two) times daily. 04/29/21   Tomi Bamberger, PA-C  ?ondansetron (ZOFRAN-ODT) 4 MG disintegrating tablet Take by mouth. 03/03/16   [provider]  ?Prenatal Vit-Fe Fumarate-FA (M-NATAL PLUS) 27-1 MG TABS Take 1 tablet by mouth daily. 02/27/21   [provider]  ?propranolol ER (INDERAL LA) 80 MG 24 hr capsule Take 80 mg by mouth at bedtime. 04/24/21   [provider]  ?rizatriptan (MAXALT) 5 MG tablet Take by mouth. 04/25/21    [provider]  ?traZODone (DESYREL) 50 MG tablet Take by mouth.    [provider]  ?valACYclovir (VALTREX) 1000 MG tablet SMARTSIG:1 Tablet(s) By Mouth Every 12 Hours 05/20/20   [provider]  ?venlafaxine XR (EFFEXOR-XR) 150 MG 24 hr capsule Take 150 mg by mouth daily. 02/27/21   [provider]  ? ? ?Family History ?Family History  ?Problem Relation Age of Onset  ? Hypertension Mother   ? Other Mother   ? Other Father   ? Hypertension Father   ? Other Brother   ? ? ?Social History ?Social History  ? ?Tobacco Use  ? Smoking status: Every Day  ?  Packs/day: 0.25  ?  Types: Cigarettes  ? Smokeless tobacco: Never  ? Tobacco comments:  ?  04/30/21 trying to quit  ?Substance Use Topics  ? Alcohol use: Never  ? Drug use: Never  ? ? ? ?Allergies   ?Gramineae pollens ? ? ?Review of Systems ?Review of Systems  ?Constitutional:  Negative for chills and fever.  ?HENT:  Positive for congestion and sore throat.   ?Eyes:  Negative for discharge and redness.  ?Respiratory:  Positive for cough.   ?Gastrointestinal:  Negative for diarrhea and vomiting.  ? ? ?Physical Exam ?Triage Vital Signs ?ED Triage Vitals [05/21/21 1537]  ?Enc Vitals Group  ?   BP 121/74  ?   Pulse Rate 73  ?   Resp 18  ?   Temp 98 ?F (36.7 ?C)  ?   Temp Source Oral  ?   SpO2 98 %  ?   Weight   ?   Height   ?   Head Circumference   ?   Peak Flow   ?   Pain Score 0  ?   Pain Loc   ?   Pain Edu?   ?   Excl. in GC?   ? ?No data found. ? ?Updated Vital Signs ?BP 121/74 (BP Location: Left Arm)   Pulse 73   Temp 98 ?F (36.7 ?C) (Oral)   Resp 18   SpO2 98%  ?   ? ?Physical Exam ?Vitals and nursing note reviewed.  ?Constitutional:   ?   General: She is not in acute distress. ?   Appearance: Normal appearance. She is not ill-appearing.  ?HENT:  ?   Head: Normocephalic and atraumatic.  ?   Nose: Congestion present.  ?   Mouth/Throat:  ?   Mouth: Mucous membranes are moist.  ?   Pharynx: Posterior oropharyngeal erythema present.  No oropharyngeal exudate.  ?Eyes:  ?   Conjunctiva/sclera: Conjunctivae normal.  ?Cardiovascular:  ?   Rate and Rhythm: Normal rate.  ?   Heart sounds: No murmur heard. ?Pulmonary:  ?   Effort: Pulmonary effort is normal. No respiratory distress.  ?Skin: ?   General: Skin is  warm and dry.  ?Neurological:  ?   Mental Status: She is alert.  ?Psychiatric:     ?   Mood and Affect: Mood normal.     ?   Thought Content: Thought content normal.  ? ? ? ?UC Treatments / Results  ?Labs ?(all labs ordered are listed, but only abnormal results are displayed) ?Labs Reviewed - No data to display ? ?EKG ? ? ?Radiology ?No results found. ? ?Procedures ?Procedures (including critical care time) ? ?Medications Ordered in UC ?Medications - No data to display ? ?Initial Impression / Assessment and Plan / UC Course  ?I have reviewed the triage vital signs and the nursing notes. ? ?Pertinent labs & imaging results that were available during my care of the patient were reviewed by me and considered in my medical decision making (see chart for details). ? ? Will treat with antibiotic given positive culture. Recommended follow up if no improvement or if symptoms worsen in any way. Cough syrup prescribed as tessalon pearls have not been helpful with cough.  ? ?Final Clinical Impressions(s) / UC Diagnoses  ? ?Final diagnoses:  ?Acute pharyngitis, unspecified etiology  ? ?Discharge Instructions   ?None ?  ? ?ED Prescriptions   ? ? Medication Sig Dispense Auth. Provider  ? amoxicillin (AMOXIL) 500 MG capsule Take 1 capsule (500 mg total) by mouth 3 (three) times daily. 21 capsule Tomi BambergerMyers, Daiel Strohecker F, PA-C  ? promethazine-dextromethorphan (PROMETHAZINE-DM) 6.25-15 MG/5ML syrup Take 5 mLs by mouth 4 (four) times daily as needed for cough. 118 mL Tomi BambergerMyers, Rily Nickey F, PA-C  ? ?  ? ?PDMP not reviewed this encounter. ?  ?Tomi BambergerMyers, Caleigh Rabelo F, PA-C ?05/21/21 1615 ? ?

## 2021-05-21 NOTE — ED Triage Notes (Signed)
Pt c/o sore throat onset Sunday. States was seen here on Monday. States sore throat and cough has gotten worse. States medication has not helped.  ?

## 2021-06-10 ENCOUNTER — Telehealth: Payer: Self-pay | Admitting: Physician Assistant

## 2021-06-10 NOTE — Telephone Encounter (Signed)
Scheduled appt per 4/11 referral. Pt is aware of appt date and time. Pt is aware to arrive 15 mins prior to appt time and to bring and updated insurance card. Pt is aware of appt location.   ?

## 2021-06-24 NOTE — Progress Notes (Signed)
?[facility] ?Telephone:([phone]) [phone]   [phone] ? ?INITIAL CONSULT NOTE ? ?Patient Care Team: ?Pcp, No as PCP - General ? ?Hematological/Oncological History ?1) Labs from PCP, Moshe Cipro NP: ?06/09/2021: WBC 8.1, Hgb 10.8 (L), MCV 81.6, Plt 412 (H),TIBC 548 (H), Iron 17 (L), Saturation 3% (L), Transferrin 32 (H). ? ?2) 06/25/2021: Establish care with [facility] ? ?CHIEF COMPLAINTS/PURPOSE OF CONSULTATION:  ?"Iron deficiency anemia " ? ?HISTORY OF PRESENTING ILLNESS:  ?Judith Estes 31 y.o. female with medical history significant for anxiety, depression, headaches, h/o gastric bypass surgery.  She is unaccompanied for this visit.  She recently moved from [place] to [place] and is here to establish care for chronic iron deficiency anemia. ? ?On exam today, Judith Estes reports chronic fatigue for the last several months.  She is able to complete her daily activities but requires frequent resting.  She has recently exhibited dizzy spells without any syncopal episodes.  She denies any dietary restrictions. She experiences occasional episodes of nausea without any vomiting.  She denies any abdominal pain but does experience constipation.  Her bowel movements are once every 4 to 5 days.  She takes Dulcolax as needed with normal improvement.  She denies easy bruising or signs of active bleeding.  Her menstrual cycles are regular lasting 3 to 4 days and changing her tampon/pads up to 3 times a day.  She craves ice regularly.  She denies fevers, chills, night sweats, shortness of breath, chest pain or cough.  She has no other complaints.  Rest of the 10 point ROS is below ? ?MEDICAL HISTORY:  ?Past Medical History:  ?Diagnosis Date  ? Anemia   ? Anxiety   ? Depression   ? Headache   ? ? ?SURGICAL HISTORY: ?Past Surgical History:  ?Procedure Laterality Date  ? ACHILLES TENDON REPAIR Right   ? APPENDECTOMY    ? GASTRIC BYPASS    ? TYMPANOSTOMY TUBE PLACEMENT    ? ? ?SOCIAL  HISTORY: ?Social History  ? ?Socioeconomic History  ? Marital status: Single  ?  Spouse name: Not on file  ? Number of children: 0  ? Years of education: Not on file  ? Highest education level: Some college, no degree  ?Occupational History  ? Not on file  ?Tobacco Use  ? Smoking status: Every Day  ?  Packs/day: 0.25  ?  Years: 10.00  ?  Pack years: 2.50  ?  Types: Cigarettes  ? Smokeless tobacco: Never  ? Tobacco comments:  ?  04/30/21 trying to quit  ?Vaping Use  ? Vaping Use: Not on file  ?Substance and Sexual Activity  ? Alcohol use: Yes  ?  Comment: occasional  ? Drug use: Never  ? Sexual activity: Not on file  ?Other Topics Concern  ? Not on file  ?Social History Narrative  ? Caffeine- ice coffee 1 a day, soda occas  ? ?Social Determinants of Health  ? ?Financial Resource Strain: Not on file  ?Food Insecurity: Not on file  ?Transportation Needs: Not on file  ?Physical Activity: Not on file  ?Stress: Not on file  ?Social Connections: Not on file  ?Intimate Partner Violence: Not on file  ? ? ?FAMILY HISTORY: ?Family History  ?Problem Relation Age of Onset  ? Hypertension Mother   ? Other Mother   ? Other Father   ? Hypertension Father   ? Other Brother   ? Breast cancer Maternal Grandmother   ? Cancer Half-Sister   ? ? ?  ALLERGIES:  is allergic to gramineae pollens. ? ?MEDICATIONS:  ?Current Outpatient Medications  ?Medication Sig Dispense Refill  ? albuterol (VENTOLIN HFA) 108 (90 Base) MCG/ACT inhaler Inhale 2 puffs into the lungs 4 (four) times daily. As needed    ? Cholecalciferol (VITAMIN D3) 1.25 MG (50000 UT) CAPS Take 1 capsule by mouth once a week.    ? Docusate Sodium (DSS) 100 MG CAPS Take by mouth.    ? Prenatal Vit-Fe Fumarate-FA (M-NATAL PLUS) 27-1 MG TABS Take 1 tablet by mouth daily.    ? propranolol ER (INDERAL LA) 80 MG 24 hr capsule Take 80 mg by mouth at bedtime.    ? rizatriptan (MAXALT) 5 MG tablet Take by mouth.    ? traZODone (DESYREL) 50 MG tablet Take by mouth.    ? valACYclovir (VALTREX)  1000 MG tablet SMARTSIG:1 Tablet(s) By Mouth Every 12 Hours    ? venlafaxine XR (EFFEXOR-XR) 150 MG 24 hr capsule Take 150 mg by mouth daily.    ? fluticasone (FLONASE) 50 MCG/ACT nasal spray Place 1 spray into both nostrils daily for 3 days. 16 g 0  ? loperamide (IMODIUM) 2 MG capsule Take 1 capsule (2 mg total) by mouth 2 (two) times daily as needed for diarrhea or loose stools. 14 capsule 0  ? ondansetron (ZOFRAN-ODT) 8 MG disintegrating tablet Take 1 tablet (8 mg total) by mouth every 8 (eight) hours as needed for nausea or vomiting. 20 tablet 0  ? ?No current facility-administered medications for this visit.  ? ? ?REVIEW OF SYSTEMS:   ?Constitutional: ( - ) fevers, ( - )  chills , ( - ) night sweats ?Eyes: ( - ) blurriness of vision, ( - ) double vision, ( - ) watery eyes ?Ears, nose, mouth, throat, and face: ( - ) mucositis, ( - ) sore throat ?Respiratory: ( - ) cough, ( - ) dyspnea, ( - ) wheezes ?Cardiovascular: ( - ) palpitation, ( - ) chest discomfort, ( - ) lower extremity swelling ?Gastrointestinal:  ( + ) nausea, ( - ) heartburn, ( +) change in bowel habits ?Skin: ( - ) abnormal skin rashes ?Lymphatics: ( - ) new lymphadenopathy, ( - ) easy bruising ?Neurological: ( - ) numbness, ( - ) tingling, ( - ) new weaknesses ?Behavioral/Psych: ( - ) mood change, ( - ) new changes  ?All other systems were reviewed with the patient and are negative. ? ?PHYSICAL EXAMINATION: ?ECOG PERFORMANCE STATUS: 1 - Symptomatic but completely ambulatory ? ?Vitals:  ? 06/25/21 1409  ?BP: 113/73  ?Pulse: 86  ?Resp: 16  ?Temp: (!) 97.3 ?F (36.3 ?C)  ?SpO2: 99%  ? ?Filed Weights  ? 06/25/21 1409  ?Weight: 298 lb 8 oz (135.4 kg)  ? ? ?GENERAL: well appearing female in NAD  ?SKIN: skin color, texture, turgor are normal, no rashes or significant lesions ?EYES: conjunctiva are pink and non-injected, sclera clear ?NECK: supple, non-tender ?LYMPH:  no palpable lymphadenopathy in the cervical or supraclavicular lymph nodes.  ?LUNGS:  clear to auscultation and percussion with normal breathing effort ?HEART: regular rate & rhythm and no murmurs and no lower extremity edema ?ABDOMEN: soft, non-tender, non-distended, normal bowel sounds ?Musculoskeletal: no cyanosis of digits and no clubbing  ?PSYCH: alert & oriented x 3, fluent speech ?NEURO: no focal motor/sensory deficits ? ?LABORATORY DATA:  ?I have reviewed the data as listed ? ?  Latest Ref Rng & Units 06/25/2021  ?  3:11 PM 05/10/2020  ?  8:12 PM 11/23/2009  ?  9:58 AM  ?CBC  ?WBC 4.0 - 10.5 K/uL 7.6   10.0   10.6    ?Hemoglobin 12.0 - 15.0 g/dL 16.9   67.8   93.8    ?Hematocrit 36.0 - 46.0 % 37.0   42.3   40.6    ?Platelets 150 - 400 K/uL 249   246   318    ? ? ? ?  Latest Ref Rng & Units 06/25/2021  ?  3:11 PM 05/10/2020  ?  8:12 PM 11/23/2009  ?  9:58 AM  ?CMP  ?Glucose 70 - 99 mg/dL 87   89   93    ?BUN 6 - 20 mg/dL 9   9   7     ?Creatinine 0.44 - 1.00 mg/dL   1.01   7.51    ?Sodium 135 - 145 mmol/L 140   135   140    ?Potassium 3.5 - 5.1 mmol/L 4.3   3.9   4.2    ?Chloride 98 - 111 mmol/L 108   103   107    ?CO2 22 - 32 mmol/L 26   20   26     ?Calcium 8.9 - 10.3 mg/dL 9.2   8.7   9.6    ?Total Protein 6.5 - 8.1 g/dL 8.7    7.7    ?Total Bilirubin 0.3 - 1.2 mg/dL 0.4    0.4    ?Alkaline Phos 38 - 126 U/L 101    60    ?AST 15 - 41 U/L 18    15    ?ALT 0 - 44 U/L 19    11    ? ? ? ?RADIOGRAPHIC STUDIES: ?I have personally reviewed the radiological images as listed and agreed with the findings in the report. ?No results found. ? ?ASSESSMENT & PLAN ?Judith Estes is a 31 y.o. female who presents to the clinic for evaluation for iron deficiency anemia.  We reviewed the outside labs from 06/09/2021 that confirms iron deficiency anemia.  Due to gastric bypass history, patient has received IV iron infusions in the past.  We recommend to repeat labs today to check CBC, CMP, reticulocyte panel, ferritin, iron and TIBC.  If today's labs confirm iron deficiency anemia, would recommend IV iron  infusions.  Additionally, due to history of gastric bypass we will check vitamin B12 level as well.  ? 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

## 2021-06-25 ENCOUNTER — Other Ambulatory Visit: Payer: Self-pay

## 2021-06-25 ENCOUNTER — Inpatient Hospital Stay: Payer: 59

## 2021-06-25 ENCOUNTER — Inpatient Hospital Stay: Payer: 59 | Attending: Physician Assistant | Admitting: Physician Assistant

## 2021-06-25 ENCOUNTER — Encounter: Payer: Self-pay | Admitting: Physician Assistant

## 2021-06-25 VITALS — BP 113/73 | HR 86 | Temp 97.3°F | Resp 16 | Wt 298.5 lb

## 2021-06-25 DIAGNOSIS — E538 Deficiency of other specified B group vitamins: Secondary | ICD-10-CM | POA: Diagnosis not present

## 2021-06-25 DIAGNOSIS — Z9884 Bariatric surgery status: Secondary | ICD-10-CM | POA: Diagnosis not present

## 2021-06-25 DIAGNOSIS — D508 Other iron deficiency anemias: Secondary | ICD-10-CM | POA: Insufficient documentation

## 2021-06-25 DIAGNOSIS — D649 Anemia, unspecified: Secondary | ICD-10-CM | POA: Insufficient documentation

## 2021-06-25 DIAGNOSIS — K5900 Constipation, unspecified: Secondary | ICD-10-CM | POA: Diagnosis not present

## 2021-06-25 DIAGNOSIS — K5909 Other constipation: Secondary | ICD-10-CM

## 2021-06-25 LAB — CBC WITH DIFFERENTIAL (CANCER CENTER ONLY)
Abs Immature Granulocytes: 0.03 10*3/uL (ref 0.00–0.07)
Basophils Absolute: 0 10*3/uL (ref 0.0–0.1)
Basophils Relative: 1 %
Eosinophils Absolute: 0 10*3/uL (ref 0.0–0.5)
Eosinophils Relative: 0 %
HCT: 37 % (ref 36.0–46.0)
Hemoglobin: 11.3 g/dL — ABNORMAL LOW (ref 12.0–15.0)
Immature Granulocytes: 0 %
Lymphocytes Relative: 30 %
Lymphs Abs: 2.3 10*3/uL (ref 0.7–4.0)
MCH: 25.7 pg — ABNORMAL LOW (ref 26.0–34.0)
MCHC: 30.5 g/dL (ref 30.0–36.0)
MCV: 84.1 fL (ref 80.0–100.0)
Monocytes Absolute: 0.6 10*3/uL (ref 0.1–1.0)
Monocytes Relative: 7 %
Neutro Abs: 4.7 10*3/uL (ref 1.7–7.7)
Neutrophils Relative %: 62 %
Platelet Count: 249 10*3/uL (ref 150–400)
RBC: 4.4 MIL/uL (ref 3.87–5.11)
RDW: 14 % (ref 11.5–15.5)
WBC Count: 7.6 10*3/uL (ref 4.0–10.5)
nRBC: 0 % (ref 0.0–0.2)

## 2021-06-25 LAB — CMP (CANCER CENTER ONLY)
ALT: 19 U/L (ref 0–44)
AST: 18 U/L (ref 15–41)
Albumin: 4 g/dL (ref 3.5–5.0)
Alkaline Phosphatase: 101 U/L (ref 38–126)
Anion gap: 6 (ref 5–15)
BUN: 9 mg/dL (ref 6–20)
CO2: 26 mmol/L (ref 22–32)
Calcium: 9.2 mg/dL (ref 8.9–10.3)
Chloride: 108 mmol/L (ref 98–111)
Creatinine: 0.68 mg/dL (ref 0.44–1.00)
GFR, Estimated: >60 mL/min (ref >60)
Glucose, Bld: 87 mg/dL (ref 70–99)
Potassium: 4.3 mmol/L (ref 3.5–5.1)
Sodium: 140 mmol/L (ref 135–145)
Total Bilirubin: 0.4 mg/dL (ref 0.3–1.2)
Total Protein: 8.7 g/dL — ABNORMAL HIGH (ref 6.5–8.1)

## 2021-06-25 LAB — IRON AND IRON BINDING CAPACITY (CC-WL,HP ONLY)
Iron: 22 ug/dL — ABNORMAL LOW (ref 28–170)
Saturation Ratios: 4 % — ABNORMAL LOW (ref 10.4–31.8)
TIBC: 515 ug/dL — ABNORMAL HIGH (ref 250–450)
UIBC: 493 ug/dL

## 2021-06-25 LAB — RETIC PANEL
Immature Retic Fract: 15 % (ref 2.3–15.9)
RBC.: 4.43 MIL/uL (ref 3.87–5.11)
Retic Count, Absolute: 51.4 10*3/uL (ref 19.0–186.0)
Retic Ct Pct: 1.2 % (ref 0.4–3.1)
Reticulocyte Hemoglobin: 26 pg — ABNORMAL LOW (ref >27.9)

## 2021-06-25 LAB — VITAMIN B12: Vitamin B-12: 344 pg/mL (ref 180–914)

## 2021-06-26 LAB — FERRITIN: Ferritin: 5 ng/mL — ABNORMAL LOW (ref 11–307)

## 2021-06-29 LAB — METHYLMALONIC ACID, SERUM: Methylmalonic Acid, Quantitative: 184 nmol/L (ref 0–378)

## 2021-06-30 ENCOUNTER — Telehealth: Payer: Self-pay | Admitting: Pharmacy Technician

## 2021-06-30 ENCOUNTER — Telehealth: Payer: Self-pay

## 2021-06-30 ENCOUNTER — Other Ambulatory Visit: Payer: Self-pay | Admitting: Physician Assistant

## 2021-06-30 DIAGNOSIS — D509 Iron deficiency anemia, unspecified: Secondary | ICD-10-CM | POA: Insufficient documentation

## 2021-06-30 DIAGNOSIS — D508 Other iron deficiency anemias: Secondary | ICD-10-CM

## 2021-06-30 NOTE — Telephone Encounter (Addendum)
Dr. Charlies Silvers, ?Fyi note: ? ?Auth Submission: no auth needed ?Payer: [employer] ?Medication & CPT/J Code(s) submitted: monoferric J1437 ?Route of submission (phone, fax, portal): PHONE ?Auth type: Buy/Bill ?Units/visits requested: 1 ?Reference number: [id number] ?Approval from: 06/30/21 to 07/31/21  ? ?Patient will be scheduled as soon as possible. ? ?MONOFERRIC CO-PAY CARD: Denied ?Denied due to Dx code: D50.8 ? ?Requested pre-determination/approval letter from [facility] for monoferric co-pay card. ?

## 2021-06-30 NOTE — Telephone Encounter (Signed)
Pt advised and verbalized understanding and will wait to hear from scheduling. ?

## 2021-06-30 NOTE — Telephone Encounter (Signed)
-----   Message from Briant Cedar, PA-C sent at 06/30/2021  9:58 AM EDT ----- ?Please notify patient that labs indicate iron deficiency anemia and we will arrange for IV iron. There is no evidence of B12 deficiency. We will see her back in 8 weeks to repeat labs after receiving IV iron.  ?

## 2021-07-02 ENCOUNTER — Ambulatory Visit
Admission: EM | Admit: 2021-07-02 | Discharge: 2021-07-02 | Disposition: A | Discharge status: Home / Self Care | Payer: 59 | Attending: Urgent Care | Admitting: Urgent Care

## 2021-07-02 DIAGNOSIS — K529 Noninfective gastroenteritis and colitis, unspecified: Secondary | ICD-10-CM | POA: Diagnosis not present

## 2021-07-02 DIAGNOSIS — R110 Nausea: Secondary | ICD-10-CM

## 2021-07-02 DIAGNOSIS — R1084 Generalized abdominal pain: Secondary | ICD-10-CM

## 2021-07-02 LAB — POCT URINALYSIS DIP (MANUAL ENTRY)
Bilirubin, UA: NEGATIVE
Blood, UA: NEGATIVE
Glucose, UA: NEGATIVE mg/dL
Ketones, POC UA: NEGATIVE mg/dL
Leukocytes, UA: NEGATIVE
Nitrite, UA: NEGATIVE
Protein Ur, POC: NEGATIVE mg/dL
Spec Grav, UA: >=1.03 — AB (ref 1.010–1.025)
Urobilinogen, UA: 1 E.U./dL
pH, UA: 5.5 (ref 5.0–8.0)

## 2021-07-02 LAB — POCT URINE PREGNANCY: Preg Test, Ur: NEGATIVE

## 2021-07-02 MED ORDER — LOPERAMIDE HCL 2 MG PO CAPS: 2.0000 mg | ORAL_CAPSULE | Freq: Two times a day (BID) | ORAL | 0 refills | Status: AC | PRN

## 2021-07-02 MED ORDER — ONDANSETRON 8 MG PO TBDP: 8.0000 mg | ORAL_TABLET | Freq: Three times a day (TID) | ORAL | 0 refills | Status: DC | PRN

## 2021-07-02 NOTE — ED Triage Notes (Signed)
Pt c/o sharp pain to umbilicus area onset ~ 1 hour ago. Associated nausea. States she ate ~ 2 hours ago.  ? ?Denies emesis, diarrhea, constipation.  ? ?Last cycle 4/4-8.  ?

## 2021-07-02 NOTE — ED Provider Notes (Signed)
?[facility] ? ? ?MRN: [id number] DOB: [birth year] ? ?Subjective:  ? ?Judith Estes is a 31 y.o. female presenting for acute onset of severe abdominal pain over the upper and mid abdomen an hour prior to coming into clinic.  Patient states she ate an hour before her pain started.  Past medical history of appendectomy, gastric bypass.  She is concerned about an ulcer, gallbladder disease.  No fever, vomiting, diarrhea, constipation, chest pain, shortness of breath.  No marijuana use. ? ?No current facility-administered medications for this encounter. ? ?Current Outpatient Medications:  ?  albuterol (VENTOLIN HFA) 108 (90 Base) MCG/ACT inhaler, Inhale 2 puffs into the lungs 4 (four) times daily. As needed, Disp: , Rfl:  ?  Cholecalciferol (VITAMIN D3) 1.25 MG (50000 UT) CAPS, Take 1 capsule by mouth once a week., Disp: , Rfl:  ?  Docusate Sodium (DSS) 100 MG CAPS, Take by mouth., Disp: , Rfl:  ?  fluticasone (FLONASE) 50 MCG/ACT nasal spray, Place 1 spray into both nostrils daily for 3 days., Disp: 16 g, Rfl: 0 ?  ondansetron (ZOFRAN-ODT) 4 MG disintegrating tablet, Take by mouth., Disp: , Rfl:  ?  Prenatal Vit-Fe Fumarate-FA (M-NATAL PLUS) 27-1 MG TABS, Take 1 tablet by mouth daily., Disp: , Rfl:  ?  propranolol ER (INDERAL LA) 80 MG 24 hr capsule, Take 80 mg by mouth at bedtime., Disp: , Rfl:  ?  rizatriptan (MAXALT) 5 MG tablet, Take by mouth., Disp: , Rfl:  ?  traZODone (DESYREL) 50 MG tablet, Take by mouth., Disp: , Rfl:  ?  valACYclovir (VALTREX) 1000 MG tablet, SMARTSIG:1 Tablet(s) By Mouth Every 12 Hours, Disp: , Rfl:  ?  venlafaxine XR (EFFEXOR-XR) 150 MG 24 hr capsule, Take 150 mg by mouth daily., Disp: , Rfl:   ? ?Allergies  ?Allergen Reactions  ? Gramineae Pollens Itching  ? ? ?Past Medical History:  ?Diagnosis Date  ? Anemia   ? Anxiety   ? Depression   ? Headache   ?  ? ?Past Surgical History:  ?Procedure Laterality Date  ? ACHILLES TENDON REPAIR Right   ? APPENDECTOMY    ? GASTRIC  BYPASS    ? TYMPANOSTOMY TUBE PLACEMENT    ? ? ?Family History  ?Problem Relation Age of Onset  ? Hypertension Mother   ? Other Mother   ? Other Father   ? Hypertension Father   ? Other Brother   ? Breast cancer Maternal Grandmother   ? Cancer Half-Sister   ? ? ?Social History  ? ?Tobacco Use  ? Smoking status: Every Day  ?  Packs/day: 0.25  ?  Years: 10.00  ?  Pack years: 2.50  ?  Types: Cigarettes  ? Smokeless tobacco: Never  ? Tobacco comments:  ?  04/30/21 trying to quit  ?Substance Use Topics  ? Alcohol use: Yes  ?  Comment: occasional  ? Drug use: Never  ? ? ?ROS ? ? ?Objective:  ? ?Vitals: ?BP 105/69 (BP Location: Left Arm)   Pulse 94   Temp 97.8 ?F (36.6 ?C)   Resp 16   LMP 06/03/2021 (Exact Date)   SpO2 97%  ? ?Physical Exam ?Constitutional:   ?   General: She is not in acute distress. ?   Appearance: Normal appearance. She is well-developed. She is not ill-appearing, toxic-appearing or diaphoretic.  ?HENT:  ?   Head: Normocephalic and atraumatic.  ?   Nose: Nose normal.  ?   Mouth/Throat:  ?   Mouth:  Mucous membranes are moist.  ?Eyes:  ?   General: No scleral icterus.    ?   Right eye: No discharge.     ?   Left eye: No discharge.  ?   Extraocular Movements: Extraocular movements intact.  ?   Conjunctiva/sclera: Conjunctivae normal.  ?Cardiovascular:  ?   Rate and Rhythm: Normal rate.  ?Pulmonary:  ?   Effort: Pulmonary effort is normal.  ?Abdominal:  ?   General: Bowel sounds are increased. There is no distension.  ?   Palpations: Abdomen is soft. There is no mass.  ?   Tenderness: There is abdominal tenderness in the epigastric area and periumbilical area. There is no right CVA tenderness, left CVA tenderness, guarding or rebound.  ?Skin: ?   General: Skin is warm and dry.  ?Neurological:  ?   General: No focal deficit present.  ?   Mental Status: She is alert and oriented to person, place, and time.  ?Psychiatric:     ?   Mood and Affect: Mood normal.     ?   Behavior: Behavior normal.     ?    Thought Content: Thought content normal.     ?   Judgment: Judgment normal.  ? ? ?Results for orders placed or performed during the hospital encounter of 07/02/21 (from the past 24 hour(s))  ?POCT urinalysis dipstick     Status: Abnormal  ? Collection Time: 07/02/21  7:15 PM  ?Result Value Ref Range  ? Color, UA yellow yellow  ? Clarity, UA clear clear  ? Glucose, UA negative negative mg/dL  ? Bilirubin, UA negative negative  ? Ketones, POC UA negative negative mg/dL  ? Spec Grav, UA >=1.030 (A) 1.010 - 1.025  ? Blood, UA negative negative  ? pH, UA 5.5 5.0 - 8.0  ? Protein Ur, POC negative negative mg/dL  ? Urobilinogen, UA 1.0 0.2 or 1.0 E.U./dL  ? Nitrite, UA Negative Negative  ? Leukocytes, UA Negative Negative  ?POCT urine pregnancy     Status: None  ? Collection Time: 07/02/21  7:15 PM  ?Result Value Ref Range  ? Preg Test, Ur Negative Negative  ? ? ?Assessment and Plan :  ? ?PDMP not reviewed this encounter. ? ?1. Gastroenteritis   ?2. Generalized abdominal pain   ?3. Nausea without vomiting   ? ?Suspect the start of a viral gastroenteritis.  Symptoms started an hour prior to coming into clinic.  Recommended supportive care for now.  Maintain strict ER precautions. Counseled patient on potential for adverse effects with medications prescribed/recommended today, ER and return-to-clinic precautions discussed, patient verbalized understanding. ? ?  ?Wallis Bamberg, PA-C ?07/03/21 9449 ? ?

## 2021-07-02 NOTE — Discharge Instructions (Addendum)

## 2021-07-03 ENCOUNTER — Encounter: Payer: Self-pay | Admitting: Physician Assistant

## 2021-07-08 ENCOUNTER — Ambulatory Visit (INDEPENDENT_AMBULATORY_CARE_PROVIDER_SITE_OTHER): Payer: 59

## 2021-07-08 ENCOUNTER — Telehealth (HOSPITAL_COMMUNITY): Payer: Self-pay | Admitting: Emergency Medicine

## 2021-07-08 VITALS — BP 89/57 | HR 60 | Temp 97.8°F | Resp 18 | Ht 66.0 in | Wt 301.6 lb

## 2021-07-08 DIAGNOSIS — D508 Other iron deficiency anemias: Secondary | ICD-10-CM | POA: Diagnosis not present

## 2021-07-08 DIAGNOSIS — Z9884 Bariatric surgery status: Secondary | ICD-10-CM

## 2021-07-08 DIAGNOSIS — D509 Iron deficiency anemia, unspecified: Secondary | ICD-10-CM | POA: Diagnosis not present

## 2021-07-08 LAB — CERVICOVAGINAL ANCILLARY ONLY
Chlamydia: NEGATIVE
Comment: NEGATIVE
Comment: NEGATIVE
Comment: NORMAL
Neisseria Gonorrhea: NEGATIVE
Trichomonas: NEGATIVE

## 2021-07-08 MED ORDER — SODIUM CHLORIDE 0.9 % IV SOLN
1000.0000 mg | Freq: Once | INTRAVENOUS | Status: AC
  Administered 2021-07-08: 1000 mg via INTRAVENOUS
  Filled 2021-07-08: qty 10

## 2021-07-08 NOTE — Telephone Encounter (Signed)
Received information back from lab and called to review negative results with patient.  She states she is concerned for yeast infection, and this RN encouraged f/u visit with new symptoms ?

## 2021-07-08 NOTE — Progress Notes (Signed)
Diagnosis: Iron Deficiency Anemia ? ?Provider:  Chilton Greathouse, MD ? ?Procedure: Infusion ? ?IV Type: Peripheral, IV Location: R Antecubital ? ?Monoferric (Ferric Derisomaltose), Dose: 1000 mg ? ?Infusion Start Time: 1334 ? ?Infusion Stop Time: 1403 ? ?Post Infusion IV Care: Observation period completed and Peripheral IV Discontinued ? ?Discharge: Condition: Good, Destination: Home . AVS provided to patient.  ? ?Performed by:  Loney Hering, LPN  ?  ?

## 2021-07-08 NOTE — Telephone Encounter (Signed)
Patient left a voicemail yesterday stating she is looking for cytology results.  Called down to lab who states it shows in process at this point but should result.  Called patient and updated her.  New call from patient today, no results showing in computer still.  Called down to cytology again today and they state they will need to look in to it.  Awaiting return call  ?

## 2021-07-21 ENCOUNTER — Encounter: Payer: Self-pay | Admitting: Physician Assistant

## 2021-07-21 ENCOUNTER — Telehealth: Payer: Self-pay | Admitting: Pharmacy Technician

## 2021-07-21 NOTE — Telephone Encounter (Signed)
Monoferric f/u:  Monoferric co-pay: Approved.  Pre-determination letter from [facility] has been accepted and approved by [employer].  Id# [id number] Case# [id number] Date: 07/14/21 - 03/01/22 Card# [id number] Exp: 3/28 Cvv: [phone]

## 2021-08-12 ENCOUNTER — Other Ambulatory Visit: Payer: Self-pay | Admitting: Physician Assistant

## 2021-08-12 DIAGNOSIS — D508 Other iron deficiency anemias: Secondary | ICD-10-CM

## 2021-08-13 ENCOUNTER — Inpatient Hospital Stay (HOSPITAL_BASED_OUTPATIENT_CLINIC_OR_DEPARTMENT_OTHER): Payer: 59 | Admitting: Physician Assistant

## 2021-08-13 ENCOUNTER — Ambulatory Visit: Payer: 59 | Admitting: Physician Assistant

## 2021-08-13 ENCOUNTER — Inpatient Hospital Stay: Payer: 59 | Attending: Physician Assistant

## 2021-08-13 ENCOUNTER — Other Ambulatory Visit: Payer: 59

## 2021-08-13 ENCOUNTER — Other Ambulatory Visit: Payer: Self-pay

## 2021-08-13 VITALS — BP 122/65 | HR 89 | Temp 97.2°F | Resp 17 | Wt 296.2 lb

## 2021-08-13 DIAGNOSIS — D509 Iron deficiency anemia, unspecified: Secondary | ICD-10-CM | POA: Insufficient documentation

## 2021-08-13 DIAGNOSIS — K5900 Constipation, unspecified: Secondary | ICD-10-CM | POA: Insufficient documentation

## 2021-08-13 DIAGNOSIS — F17210 Nicotine dependence, cigarettes, uncomplicated: Secondary | ICD-10-CM | POA: Insufficient documentation

## 2021-08-13 DIAGNOSIS — Z9884 Bariatric surgery status: Secondary | ICD-10-CM | POA: Diagnosis not present

## 2021-08-13 DIAGNOSIS — D508 Other iron deficiency anemias: Secondary | ICD-10-CM

## 2021-08-13 LAB — CMP (CANCER CENTER ONLY)
ALT: 15 U/L (ref 0–44)
AST: 18 U/L (ref 15–41)
Albumin: 3.5 g/dL (ref 3.5–5.0)
Alkaline Phosphatase: 87 U/L (ref 38–126)
Anion gap: 6 (ref 5–15)
BUN: 8 mg/dL (ref 6–20)
CO2: 24 mmol/L (ref 22–32)
Calcium: 9 mg/dL (ref 8.9–10.3)
Chloride: 111 mmol/L (ref 98–111)
Creatinine: 0.8 mg/dL (ref 0.44–1.00)
GFR, Estimated: >60 mL/min (ref >60)
Glucose, Bld: 158 mg/dL — ABNORMAL HIGH (ref 70–99)
Potassium: 4.3 mmol/L (ref 3.5–5.1)
Sodium: 141 mmol/L (ref 135–145)
Total Bilirubin: 0.4 mg/dL (ref 0.3–1.2)
Total Protein: 7.2 g/dL (ref 6.5–8.1)

## 2021-08-13 LAB — CBC WITH DIFFERENTIAL (CANCER CENTER ONLY)
Abs Immature Granulocytes: 0.03 10*3/uL (ref 0.00–0.07)
Basophils Absolute: 0 10*3/uL (ref 0.0–0.1)
Basophils Relative: 1 %
Eosinophils Absolute: 0.2 10*3/uL (ref 0.0–0.5)
Eosinophils Relative: 2 %
HCT: 40.6 % (ref 36.0–46.0)
Hemoglobin: 13.1 g/dL (ref 12.0–15.0)
Immature Granulocytes: 0 %
Lymphocytes Relative: 33 %
Lymphs Abs: 2.8 10*3/uL (ref 0.7–4.0)
MCH: 29.2 pg (ref 26.0–34.0)
MCHC: 32.3 g/dL (ref 30.0–36.0)
MCV: 90.6 fL (ref 80.0–100.0)
Monocytes Absolute: 0.5 10*3/uL (ref 0.1–1.0)
Monocytes Relative: 6 %
Neutro Abs: 5 10*3/uL (ref 1.7–7.7)
Neutrophils Relative %: 58 %
Platelet Count: 234 10*3/uL (ref 150–400)
RBC: 4.48 MIL/uL (ref 3.87–5.11)
RDW: 17.7 % — ABNORMAL HIGH (ref 11.5–15.5)
WBC Count: 8.5 10*3/uL (ref 4.0–10.5)
nRBC: 0 % (ref 0.0–0.2)

## 2021-08-13 LAB — IRON AND IRON BINDING CAPACITY (CC-WL,HP ONLY)
Iron: 55 ug/dL (ref 28–170)
Saturation Ratios: 17 % (ref 10.4–31.8)
TIBC: 316 ug/dL (ref 250–450)
UIBC: 261 ug/dL

## 2021-08-13 LAB — RETIC PANEL
Immature Retic Fract: 7.1 % (ref 2.3–15.9)
RBC.: 4.51 MIL/uL (ref 3.87–5.11)
Retic Count, Absolute: 41.8 10*3/uL (ref 19.0–186.0)
Retic Ct Pct: 0.9 % (ref 0.4–3.1)
Reticulocyte Hemoglobin: 34.3 pg (ref >27.9)

## 2021-08-14 ENCOUNTER — Telehealth: Payer: Self-pay

## 2021-08-14 ENCOUNTER — Telehealth: Payer: Self-pay | Admitting: Physician Assistant

## 2021-08-14 ENCOUNTER — Encounter: Payer: Self-pay | Admitting: Physician Assistant

## 2021-08-14 LAB — FERRITIN: Ferritin: 26 ng/mL (ref 11–307)

## 2021-08-14 NOTE — Progress Notes (Signed)
[facility] [phone]   [phone]  PROGRESS NOTE  Patient Care Team: Pcp, No as PCP - General  Hematological/Oncological History 1) Labs from PCP, Moshe Cipro NP: 06/09/2021: WBC 8.1, Hgb 10.8 (L), MCV 81.6, Plt 412 (H),TIBC 548 (H), Iron 17 (L), Saturation 3% (L), Transferrin 32 (H).  2) 06/25/2021: Establish care with [facility]ematology  3) 07/08/2021: Received IV monoferric 1000 mg x 1 dose  CHIEF COMPLAINTS/PURPOSE OF CONSULTATION:  "Iron deficiency anemia "  HISTORY OF PRESENTING ILLNESS:  Judith Estes 31 y.o. female returns for a follow up for iron deficiency anemia. She was last seen on 06/25/2021 to establish care. In the interim, she has received IV monoferric.   On exam today, Ms. Betters reports her energy levels have improved since receiving IV iron. She continues to complete her ADLs on her own. She denies any changes to her appetite. She denies nausea, vomiting or abdominal pain. She eats small frequent meals. Her constipation has improved.  She denies easy bruising or signs of active bleeding.  Her menstrual cycles are unchanged. She denies fevers, chills, night sweats, shortness of breath, chest pain or cough.  She has no other complaints.  Rest of the 10 point ROS is below  MEDICAL HISTORY:  Past Medical History:  Diagnosis Date   Anemia    Anxiety    Depression    Headache     SURGICAL HISTORY: Past Surgical History:  Procedure Laterality Date   ACHILLES TENDON REPAIR Right    APPENDECTOMY     GASTRIC BYPASS     TYMPANOSTOMY TUBE PLACEMENT      SOCIAL HISTORY: Social History   Socioeconomic History   Marital status: Single    Spouse name: Not on file   Number of children: 0   Years of education: Not on file   Highest education level: Some college, no degree  Occupational History   Not on file  Tobacco Use   Smoking status: Every Day    Packs/day: 0.25    Years: 10.00    Total pack years: 2.50     Types: Cigarettes   Smokeless tobacco: Never   Tobacco comments:    04/30/21 trying to quit  Vaping Use   Vaping Use: Not on file  Substance and Sexual Activity   Alcohol use: Yes    Comment: occasional   Drug use: Never   Sexual activity: Not on file  Other Topics Concern   Not on file  Social History Narrative   Caffeine- ice coffee 1 a day, soda occas   Social Determinants of Health   Financial Resource Strain: Not on file  Food Insecurity: Not on file  Transportation Needs: Not on file  Physical Activity: Not on file  Stress: Not on file  Social Connections: Not on file  Intimate Partner Violence: Not on file    FAMILY HISTORY: Family History  Problem Relation Age of Onset   Hypertension Mother    Other Mother    Other Father    Hypertension Father    Other Brother    Breast cancer Maternal Grandmother    Cancer Half-Sister     ALLERGIES:  is allergic to gramineae pollens.  MEDICATIONS:  Current Outpatient Medications  Medication Sig Dispense Refill   albuterol (VENTOLIN HFA) 108 (90 Base) MCG/ACT inhaler Inhale 2 puffs into the lungs 4 (four) times daily. As needed     Cholecalciferol (VITAMIN D3) 1.25 MG (50000 UT) CAPS Take 1 capsule by  mouth once a week.     Docusate Sodium (DSS) 100 MG CAPS Take by mouth.     loperamide (IMODIUM) 2 MG capsule Take 1 capsule (2 mg total) by mouth 2 (two) times daily as needed for diarrhea or loose stools. 14 capsule 0   ondansetron (ZOFRAN-ODT) 8 MG disintegrating tablet Take 1 tablet (8 mg total) by mouth every 8 (eight) hours as needed for nausea or vomiting. 20 tablet 0   Prenatal Vit-Fe Fumarate-FA (M-NATAL PLUS) 27-1 MG TABS Take 1 tablet by mouth daily.     propranolol ER (INDERAL LA) 80 MG 24 hr capsule Take 80 mg by mouth at bedtime.     rizatriptan (MAXALT) 5 MG tablet Take by mouth.     traZODone (DESYREL) 50 MG tablet Take by mouth.     valACYclovir (VALTREX) 1000 MG tablet SMARTSIG:1 Tablet(s) By Mouth Every 12  Hours     venlafaxine XR (EFFEXOR-XR) 150 MG 24 hr capsule Take 150 mg by mouth daily.     fluticasone (FLONASE) 50 MCG/ACT nasal spray Place 1 spray into both nostrils daily for 3 days. 16 g 0   No current facility-administered medications for this visit.    REVIEW OF SYSTEMS:   Constitutional: ( - ) fevers, ( - )  chills , ( - ) night sweats Eyes: ( - ) blurriness of vision, ( - ) double vision, ( - ) watery eyes Ears, nose, mouth, throat, and face: ( - ) mucositis, ( - ) sore throat Respiratory: ( - ) cough, ( - ) dyspnea, ( - ) wheezes Cardiovascular: ( - ) palpitation, ( - ) chest discomfort, ( - ) lower extremity swelling Gastrointestinal:  ( - ) nausea, ( - ) heartburn, ( -) change in bowel habits Skin: ( - ) abnormal skin rashes Lymphatics: ( - ) new lymphadenopathy, ( - ) easy bruising Neurological: ( - ) numbness, ( - ) tingling, ( - ) new weaknesses Behavioral/Psych: ( - ) mood change, ( - ) new changes  All other systems were reviewed with the patient and are negative.  PHYSICAL EXAMINATION: ECOG PERFORMANCE STATUS: 1 - Symptomatic but completely ambulatory  Vitals:   08/13/21 1502  BP: 122/65  Pulse: 89  Resp: 17  Temp: (!) 97.2 F (36.2 C)  SpO2: 95%   Filed Weights   08/13/21 1502  Weight: 296 lb 3 oz (134.3 kg)    GENERAL: well appearing female in NAD  SKIN: skin color, texture, turgor are normal, no rashes or significant lesions EYES: conjunctiva are pink and non-injected, sclera clear  LUNGS: clear to auscultation and percussion with normal breathing effort HEART: regular rate & rhythm and no murmurs and no lower extremity edema Musculoskeletal: no cyanosis of digits and no clubbing  PSYCH: alert & oriented x 3, fluent speech NEURO: no focal motor/sensory deficits  LABORATORY DATA:  I have reviewed the data as listed    Latest Ref Rng & Units 08/13/2021    2:45 PM 06/25/2021    3:11 PM 05/10/2020    8:12 PM  CBC  WBC 4.0 - 10.5 K/uL 8.5  7.6  10.0    Hemoglobin 12.0 - 15.0 g/dL 13.1  11.3  14.0   Hematocrit 36.0 - 46.0 % 40.6  37.0  42.3   Platelets 150 - 400 K/uL 234  249  246        Latest Ref Rng & Units 08/13/2021    2:45 PM 06/25/2021    3:11 PM  05/10/2020    8:12 PM  CMP  Glucose 70 - 99 mg/dL 938  87  89   BUN 6 - 20 mg/dL 8  9  9    Creatinine 0.44 - 1.00 mg/dL  1.01  7.51   Sodium 135 - 145 mmol/L 141  140  135   Potassium 3.5 - 5.1 mmol/L 4.3  4.3  3.9   Chloride 98 - 111 mmol/L 111  108  103   CO2 22 - 32 mmol/L 24  26  20    Calcium 8.9 - 10.3 mg/dL 9.0  9.2  8.7   Total Protein 6.5 - 8.1 g/dL 7.2  8.7    Total Bilirubin 0.3 - 1.2 mg/dL 0.4  0.4    Alkaline Phos 38 - 126 U/L 87  101    AST 15 - 41 U/L 18  18    ALT 0 - 44 U/L 15  19       RADIOGRAPHIC STUDIES: I have personally reviewed the radiological images as listed and agreed with the findings in the report. No results found.  ASSESSMENT & PLAN ARMENTA ERSKIN is a 31 y.o. female returns for a follow up for iron deficiency anemia.   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 Iron panel shows improvement with ferritin 26, iron 55, saturation 17%.  --No need for additional IV iron at this time. --RTC in 3 months with repeat labs  #H/O Vitamin B12 deficiency: --Vitamin B12 and MMA levels was checked on 06/25/2021, no evidence of deficiency.  --Continue to check levels periodically  #Constipation: --Improved. Continue to take dulcolax daily and add miralax if needed.   Follow up: --RTC in 3 months with repeat labs.  No orders of the defined types were placed in this encounter.   All questions were answered. The patient knows to call the clinic with any problems, questions or concerns.  I have spent a total of 25 minutes minutes of face-to-face and non-face-to-face time, preparing to  see the patient, performing a medically appropriate examination, counseling and educating the patient, ordering tests, documenting clinical information in the electronic health record, and care coordination.   09/07/2021, PA-C Department of Hematology/Oncology [facility] Phone: [phone]

## 2021-08-14 NOTE — Telephone Encounter (Signed)
Pt advised with VU 

## 2021-08-14 NOTE — Telephone Encounter (Signed)
Per 6/14 los called and spoke to pt about appointment.  Pt confirmed appointment  

## 2021-08-14 NOTE — Telephone Encounter (Signed)
-----   Message from Briant Cedar, PA-C sent at 08/14/2021 10:23 AM EDT ----- Please let her know anemia has resolved and iron levels have improved. We will see her back in 3 months.

## 2021-08-18 ENCOUNTER — Encounter: Payer: Self-pay | Admitting: Psychiatry

## 2021-08-18 ENCOUNTER — Ambulatory Visit: Payer: 59 | Admitting: Psychiatry

## 2021-08-18 NOTE — Progress Notes (Deleted)
   CC:  headaches  Follow-up Visit  Last visit: 04/30/21  Brief HPI: 31 year old female without significant medical history who follows in clinic for migraines.  At her last visit she was given Toradol to help break her migraine cycle. She was continued on propranolol for prevention and Maxalt for rescue. Interval History: ***   Headache days per month: *** Headache free days per month: *** Headache severity: ***  Current Headache Regimen: Preventative: *** Abortive: ***  # of doses of abortive medications per month: ***  Prior Therapies                                  Zofran Celexa 10 mg daily Effexor 150 mg daily Topamax Propranolol 80 mg daily Imitrex Maxalt 5 mg PRN Nurtec  Physical Exam:   Vital Signs: There were no vitals taken for this visit. GENERAL:  well appearing, in no acute distress, alert  SKIN:  Color, texture, turgor normal. No rashes or lesions HEAD:  Normocephalic/atraumatic. RESP: normal respiratory effort MSK:  No gross joint deformities.   NEUROLOGICAL: Mental Status: Alert, oriented to person, place and time, Follows commands, and Speech fluent and appropriate. Cranial Nerves: PERRL, face symmetric, no dysarthria, hearing grossly intact Motor: moves all extremities equally Gait: normal-based.  IMPRESSION: ***  PLAN: ***   Follow-up: ***  I spent a total of *** minutes on the date of the service. Headache education was done. Discussed lifestyle modification including increased oral hydration, decreased caffeine, exercise and stress management. Discussed treatment options including preventive and acute medications, natural supplements, and infusion therapy. Discussed medication overuse headache and to limit use of acute treatments to no more than 2 days/week or 10 days/month. Discussed medication side effects, adverse reactions and drug interactions. Written educational materials and patient instructions outlining all of the above were  given.  Ocie Doyne, MD

## 2021-10-06 ENCOUNTER — Ambulatory Visit
Admission: RE | Admit: 2021-10-06 | Discharge: 2021-10-06 | Disposition: A | Discharge status: Home / Self Care | Payer: 59 | Source: Ambulatory Visit | Attending: Family Medicine | Admitting: Family Medicine

## 2021-10-06 VITALS — BP 104/78 | HR 75 | Temp 98.0°F | Resp 20

## 2021-10-06 DIAGNOSIS — J069 Acute upper respiratory infection, unspecified: Secondary | ICD-10-CM

## 2021-10-06 DIAGNOSIS — J029 Acute pharyngitis, unspecified: Secondary | ICD-10-CM | POA: Diagnosis not present

## 2021-10-06 LAB — POCT RAPID STREP A (OFFICE): Rapid Strep A Screen: NEGATIVE

## 2021-10-06 MED ORDER — BENZONATATE 100 MG PO CAPS: 100.0000 mg | ORAL_CAPSULE | Freq: Three times a day (TID) | ORAL | 0 refills | Status: DC | PRN

## 2021-10-06 MED ORDER — CHERATUSSIN AC 100-10 MG/5ML PO SOLN: 5.0000 mL | Freq: Four times a day (QID) | ORAL | 0 refills | Status: AC | PRN

## 2021-10-06 NOTE — ED Provider Notes (Addendum)
[facility]    CSN: [id number] Arrival date & time: 10/06/21  1726      History   Chief Complaint Chief Complaint  Patient presents with   Sore Throat    Entered by patient   Cough    HPI Judith Estes is a 31 y.o. female.    Sore Throat  Cough  Here for a 2-day history of sore throat, cough, and congestion.  She is also had malaise and then today she had some diarrhea.  For the first couple of days a sore throat predominated as her worst symptom, but then she has started coughing more today.  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   Acute appendicitis 03/21/2016   Insomnia 09/30/2015   Sleep apnea 09/30/2015   Light and infrequent menstruation 05/05/2013   Migraine with aura 05/05/2013   UPPER RESPIRATORY INFECTION, ACUTE 01/04/2009   ELEVATED BLOOD PRESSURE WITHOUT DIAGNOSIS OF HYPERTENSION 01/04/2009   ACNE VULGARIS, 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      Home Medications    Prior to Admission medications   Medication Sig Start Date End Date Taking? Authorizing Provider  guaiFENesin-codeine (CHERATUSSIN AC) 100-10 MG/5ML syrup Take 5 mLs by mouth 4 (four) times daily as needed for cough. 10/06/21  Yes Bernice Mcauliffe, Janace Aris, MD  albuterol (VENTOLIN HFA) 108 (90 Base) MCG/ACT inhaler Inhale 2 puffs into  the lungs 4 (four) times daily. As needed 11/26/20   [provider]  Cholecalciferol (VITAMIN D3) 1.25 MG (50000 UT) CAPS Take 1 capsule by mouth once a week. 02/13/21   [provider]  Docusate Sodium (DSS) 100 MG CAPS Take by mouth. 03/22/16   [provider]  fluticasone (FLONASE) 50 MCG/ACT nasal spray Place 1 spray into both nostrils daily for 3 days. 05/19/21 05/22/21  Gustavus Bryant, FNP  loperamide (IMODIUM) 2 MG capsule Take 1 capsule (2 mg total) by mouth 2 (two) times daily as needed for diarrhea or loose stools. 07/02/21   Wallis Bamberg, PA-C  Prenatal Vit-Fe Fumarate-FA (M-NATAL PLUS) 27-1 MG TABS Take 1 tablet by mouth daily. 02/27/21   [provider]  propranolol ER (INDERAL LA) 80 MG 24 hr capsule Take 80 mg by mouth at bedtime. 04/24/21   [provider]  rizatriptan (MAXALT) 5 MG tablet Take by mouth. 04/25/21   [provider]  traZODone (DESYREL) 50 MG tablet Take by mouth.    [provider]  valACYclovir (VALTREX) 1000 MG tablet SMARTSIG:1 Tablet(s) By Mouth Every 12 Hours 05/20/20   [provider]  venlafaxine XR (EFFEXOR-XR) 150 MG 24 hr capsule Take 150 mg by mouth daily. 02/27/21   [provider]    Family History  Family History  Problem Relation Age of Onset   Hypertension Mother    Other Mother    Other Father    Hypertension Father    Other Brother    Breast cancer Maternal Grandmother    Cancer Half-Sister     Social History Social History   Tobacco Use   Smoking status: Every Day    Packs/day: 0.25    Years: 10.00    Total pack years: 2.50    Types: Cigarettes   Smokeless tobacco: Never   Tobacco comments:    04/30/21 trying to quit  Substance Use Topics   Alcohol use: Yes    Comment: occasional   Drug use: Never     Allergies   Gramineae pollens   Review of Systems Review of Systems  Respiratory:  Positive for cough.      Physical Exam Triage Vital Signs ED  Triage Vitals  Enc Vitals Group     BP 10/06/21 1741 104/78     Pulse Rate 10/06/21 1741 75     Resp 10/06/21 1741 20     Temp 10/06/21 1741 98 F (36.7 C)     Temp Source 10/06/21 1741 Oral     SpO2 10/06/21 1741 97 %     Weight --      Height --      Head Circumference --      Peak Flow --      Pain Score 10/06/21 1739 8     Pain Loc --      Pain Edu? --      Excl. in GC? --    No data found.  Updated Vital Signs BP 104/78 (BP Location: Right Arm)   Pulse 75   Temp 98 F (36.7 C) (Oral)   Resp 20   LMP 10/06/2021   SpO2 97%   Visual Acuity Right Eye Distance:   Left Eye Distance:   Bilateral Distance:    Right Eye Near:   Left Eye Near:    Bilateral Near:     Physical Exam Vitals reviewed.  Constitutional:      General: She is not in acute distress.    Appearance: She is not toxic-appearing.  HENT:     Right Ear: Tympanic membrane and ear canal normal.     Left Ear: Tympanic membrane and ear canal normal.     Nose: Nose normal.     Mouth/Throat:     Mouth: Mucous membranes are moist.     Comments: Tonsils are 2+ and equal and are erythematous.  There white exudates in the tonsillar crypts.  There is no asymmetry Eyes:     Extraocular Movements: Extraocular movements intact.     Conjunctiva/sclera: Conjunctivae normal.     Pupils: Pupils are equal, round, and reactive to light.  Cardiovascular:     Rate and Rhythm: Normal rate and regular rhythm.     Heart sounds: No murmur heard. Pulmonary:     Effort: Pulmonary effort is normal. No respiratory distress.     Breath sounds: No wheezing, rhonchi or rales.  Chest:     Chest wall: No tenderness.  Musculoskeletal:     Cervical back: Neck supple.  Lymphadenopathy:     Cervical: No cervical adenopathy.  Skin:    Capillary Refill: Capillary refill takes less than 2 seconds.     Coloration: Skin is not jaundiced or pale.  Neurological:     General: No focal deficit present.     Mental  Status: She is  alert and oriented to person, place, and time.  Psychiatric:        Behavior: Behavior normal.      UC Treatments / Results  Labs (all labs ordered are listed, but only abnormal results are displayed) Labs Reviewed  CULTURE, GROUP A STREP ([place])  NOVEL CORONAVIRUS, NAA  POCT RAPID STREP A (OFFICE)    EKG   Radiology No results found.  Procedures Procedures (including critical care time)  Medications Ordered in UC Medications - No data to display  Initial Impression / Assessment and Plan / UC Course  I have reviewed the triage vital signs and the nursing notes.  Pertinent labs & imaging results that were available during my care of the patient were reviewed by me and considered in my medical decision making (see chart for details).     Rapid strep is negative, so we will send culture and treat per protocol if positive. COVID swab is done today and if positive she is a candidate for antiviral therapy.  She is to have a prescription for Paxlovid if her test is positive.   When I went to give patient her strep results, she stated Ladona Ridgel do not work for her.  Also she stated that she cannot take ibuprofen because of her stomach surgery.  However she can take Toradol and she has some of that at home.  Robitussin with codeine sent instead of the Tessalon Perles. Final Clinical Impressions(s) / UC Diagnoses   Final diagnoses:  Viral URI with cough  Sore throat     Discharge Instructions      Your strep test is negative.  Culture of the throat will be sent, and staff will notify you if that is in turn positive.   You have been swabbed for COVID, and the test will result in the next 24 hours. Our staff will call you if positive. If the test is positive, you should quarantine for 5 days from the start of your symptoms  Take benzonatate 100 mg, 1 tab every 8 hours as needed for cough.       ED Prescriptions     Medication Sig Dispense Auth. Provider    benzonatate (TESSALON) 100 MG capsule  (Status: Discontinued) Take 1 capsule (100 mg total) by mouth 3 (three) times daily as needed for cough. 21 capsule Rad Gramling, Gwenlyn Perking, MD   guaiFENesin-codeine (CHERATUSSIN AC) 100-10 MG/5ML syrup Take 5 mLs by mouth 4 (four) times daily as needed for cough. 120 mL Barrett Henle, MD      I have reviewed the PDMP during this encounter.   Barrett Henle, MD 10/06/21 Vella Redhead, MD 10/06/21 [phone]

## 2021-10-06 NOTE — Discharge Instructions (Addendum)
Your strep test is negative.  Culture of the throat will be sent, and staff will notify you if that is in turn positive.   You have been swabbed for COVID, and the test will result in the next 24 hours. Our staff will call you if positive. If the test is positive, you should quarantine for 5 days from the start of your symptoms  Take benzonatate 100 mg, 1 tab every 8 hours as needed for cough.

## 2021-10-06 NOTE — ED Triage Notes (Addendum)
The pt c/o sore throat, productive cough, nasal congestion, body aches, and head pressure that began Saturday.   Home interventions: vitamins, mucinex

## 2021-10-07 ENCOUNTER — Inpatient Hospital Stay: Admission: RE | Admit: 2021-10-07 | Payer: 59 | Source: Ambulatory Visit

## 2021-10-07 LAB — NOVEL CORONAVIRUS, NAA: SARS-CoV-2, NAA: NOT DETECTED

## 2021-10-08 ENCOUNTER — Ambulatory Visit
Admission: RE | Admit: 2021-10-08 | Discharge: 2021-10-08 | Disposition: A | Discharge status: Home / Self Care | Payer: 59 | Source: Ambulatory Visit | Attending: Urgent Care | Admitting: Urgent Care

## 2021-10-08 ENCOUNTER — Ambulatory Visit (INDEPENDENT_AMBULATORY_CARE_PROVIDER_SITE_OTHER): Payer: 59

## 2021-10-08 VITALS — BP 107/74 | HR 79 | Temp 98.0°F | Resp 22

## 2021-10-08 DIAGNOSIS — R062 Wheezing: Secondary | ICD-10-CM | POA: Diagnosis not present

## 2021-10-08 DIAGNOSIS — J209 Acute bronchitis, unspecified: Secondary | ICD-10-CM | POA: Diagnosis not present

## 2021-10-08 DIAGNOSIS — R058 Other specified cough: Secondary | ICD-10-CM

## 2021-10-08 DIAGNOSIS — R0989 Other specified symptoms and signs involving the circulatory and respiratory systems: Secondary | ICD-10-CM

## 2021-10-08 MED ORDER — ALBUTEROL SULFATE HFA 108 (90 BASE) MCG/ACT IN AERS: 1.0000 | INHALATION_SPRAY | Freq: Four times a day (QID) | RESPIRATORY_TRACT | 0 refills | Status: AC | PRN

## 2021-10-08 MED ORDER — PREDNISONE 20 MG PO TABS: ORAL_TABLET | ORAL | 0 refills | Status: AC

## 2021-10-08 MED ORDER — PROMETHAZINE-DM 6.25-15 MG/5ML PO SYRP: 5.0000 mL | ORAL_SOLUTION | Freq: Three times a day (TID) | ORAL | 0 refills | Status: AC | PRN

## 2021-10-08 NOTE — ED Provider Notes (Signed)
[facility]   MRN: [id number] DOB: 1991-02-10  Subjective:   Judith Estes is a 31 y.o. female presenting for recheck on persistent productive cough which the patient feels is worse now.  She was seen 2 days ago and was prescribed a cough medication, cough capsules.  Reports that it is not working.  She has a history of smoking and quit 4 months ago.  COVID test was negative.  Throat culture is pending.  Has a history of allergic rhinitis.  No respiratory disorders.  No current facility-administered medications for this encounter.  Current Outpatient Medications:    albuterol (VENTOLIN HFA) 108 (90 Base) MCG/ACT inhaler, Inhale 2 puffs into the lungs 4 (four) times daily. As needed, Disp: , Rfl:    Cholecalciferol (VITAMIN D3) 1.25 MG (50000 UT) CAPS, Take 1 capsule by mouth once a week., Disp: , Rfl:    Docusate Sodium (DSS) 100 MG CAPS, Take by mouth., Disp: , Rfl:    fluticasone (FLONASE) 50 MCG/ACT nasal spray, Place 1 spray into both nostrils daily for 3 days., Disp: 16 g, Rfl: 0   guaiFENesin-codeine (CHERATUSSIN AC) 100-10 MG/5ML syrup, Take 5 mLs by mouth 4 (four) times daily as needed for cough., Disp: 120 mL, Rfl: 0   loperamide (IMODIUM) 2 MG capsule, Take 1 capsule (2 mg total) by mouth 2 (two) times daily as needed for diarrhea or loose stools., Disp: 14 capsule, Rfl: 0   Prenatal Vit-Fe Fumarate-FA (M-NATAL PLUS) 27-1 MG TABS, Take 1 tablet by mouth daily., Disp: , Rfl:    propranolol ER (INDERAL LA) 80 MG 24 hr capsule, Take 80 mg by mouth at bedtime., Disp: , Rfl:    rizatriptan (MAXALT) 5 MG tablet, Take by mouth., Disp: , Rfl:    traZODone (DESYREL) 50 MG tablet, Take by mouth., Disp: , Rfl:    valACYclovir (VALTREX) 1000 MG tablet, SMARTSIG:1 Tablet(s) By Mouth Every 12 Hours, Disp: , Rfl:    venlafaxine XR (EFFEXOR-XR) 150 MG 24 hr capsule, Take 150 mg by mouth daily., Disp: , Rfl:    Allergies  Allergen Reactions   Gramineae Pollens  Itching    Past Medical History:  Diagnosis Date   Anemia    Anxiety    Depression    Headache      Past Surgical History:  Procedure Laterality Date   ACHILLES TENDON REPAIR Right    APPENDECTOMY     GASTRIC BYPASS     TYMPANOSTOMY TUBE PLACEMENT      Family History  Problem Relation Age of Onset   Hypertension Mother    Other Mother    Other Father    Hypertension Father    Other Brother    Breast cancer Maternal Grandmother    Cancer Half-Sister     Social History   Tobacco Use   Smoking status: Every Day    Packs/day: 0.25    Years: 10.00    Total pack years: 2.50    Types: Cigarettes   Smokeless tobacco: Never   Tobacco comments:    04/30/21 trying to quit  Substance Use Topics   Alcohol use: Yes    Comment: occasional   Drug use: Never    ROS   Objective:   Vitals: BP 107/74   Pulse 79   Temp 98 F (36.7 C)   Resp (!) 22   LMP 10/06/2021   SpO2 96%   Physical Exam Constitutional:      General: She is not  in acute distress.    Appearance: Normal appearance. She is well-developed. She is obese. She is not ill-appearing, toxic-appearing or diaphoretic.  HENT:     Head: Normocephalic and atraumatic.     Nose: Nose normal.     Mouth/Throat:     Mouth: Mucous membranes are moist.     Pharynx: No oropharyngeal exudate or posterior oropharyngeal erythema.  Eyes:     General: No scleral icterus.       Right eye: No discharge.        Left eye: No discharge.     Extraocular Movements: Extraocular movements intact.  Cardiovascular:     Rate and Rhythm: Normal rate and regular rhythm.     Heart sounds: Normal heart sounds. No murmur heard.    No friction rub. No gallop.  Pulmonary:     Effort: Pulmonary effort is normal. No respiratory distress.     Breath sounds: No stridor. Wheezing and rhonchi present. No rales.  Chest:     Chest wall: No tenderness.  Skin:    General: Skin is warm and dry.  Neurological:     General: No focal deficit  present.     Mental Status: She is alert and oriented to person, place, and time.  Psychiatric:        Mood and Affect: Mood normal.        Behavior: Behavior normal.    DG Chest 2 View  Result Date: 10/08/2021 CLINICAL DATA:  Cough for 5 days EXAM: CHEST - 2 VIEW COMPARISON:  05/06/2020 FINDINGS: Frontal and lateral views of the chest are obtained. Lateral view is limited due to cassette artifact. Cardiac silhouette is unremarkable. No airspace disease, effusion, or pneumothorax. No acute bony abnormality. IMPRESSION: 1. No acute intrathoracic process. Electronically Signed   By: Sharlet Salina M.D.   On: 10/08/2021 18:04    Assessment and Plan :   PDMP not reviewed this encounter.  1. Acute bronchitis, unspecified organism   2. Wheezing   3. Rhonchi   4. Productive cough     Recommended a oral prednisone course given her physical exam, acute bronchitis.  Use supportive care otherwise.  Strep culture pending. Counseled patient on potential for adverse effects with medications prescribed/recommended today, ER and return-to-clinic precautions discussed, patient verbalized understanding.    Wallis Bamberg, [place] 10/08/21 1812

## 2021-10-08 NOTE — ED Triage Notes (Signed)
Pt returns for re-eval of cough that she has had for 5 days. Pt states it is getting worse and is productive.

## 2021-10-10 LAB — CULTURE, GROUP A STREP (THRC)

## 2021-10-13 ENCOUNTER — Telehealth: Payer: Self-pay | Admitting: Physician Assistant

## 2021-10-13 NOTE — Telephone Encounter (Signed)
Per 8/14 provider pal called and spoke to pt about r/s appointment

## 2021-10-15 ENCOUNTER — Ambulatory Visit: Payer: 59

## 2021-11-14 ENCOUNTER — Ambulatory Visit: Payer: 59 | Admitting: Physician Assistant

## 2021-11-14 ENCOUNTER — Other Ambulatory Visit: Payer: 59

## 2021-11-18 ENCOUNTER — Inpatient Hospital Stay: Payer: 59 | Admitting: Physician Assistant

## 2021-11-18 ENCOUNTER — Other Ambulatory Visit: Payer: Self-pay

## 2021-11-18 ENCOUNTER — Inpatient Hospital Stay: Payer: 59 | Attending: Physician Assistant

## 2021-11-18 DIAGNOSIS — K5900 Constipation, unspecified: Secondary | ICD-10-CM | POA: Diagnosis not present

## 2021-11-18 DIAGNOSIS — D509 Iron deficiency anemia, unspecified: Secondary | ICD-10-CM | POA: Diagnosis not present

## 2021-11-18 DIAGNOSIS — E538 Deficiency of other specified B group vitamins: Secondary | ICD-10-CM | POA: Insufficient documentation

## 2021-11-18 DIAGNOSIS — F17210 Nicotine dependence, cigarettes, uncomplicated: Secondary | ICD-10-CM | POA: Insufficient documentation

## 2021-11-18 DIAGNOSIS — D508 Other iron deficiency anemias: Secondary | ICD-10-CM

## 2021-11-18 LAB — CMP (CANCER CENTER ONLY)
ALT: 11 U/L (ref 0–44)
AST: 12 U/L — ABNORMAL LOW (ref 15–41)
Albumin: 3.9 g/dL (ref 3.5–5.0)
Alkaline Phosphatase: 97 U/L (ref 38–126)
Anion gap: 3 — ABNORMAL LOW (ref 5–15)
BUN: 8 mg/dL (ref 6–20)
CO2: 29 mmol/L (ref 22–32)
Calcium: 9 mg/dL (ref 8.9–10.3)
Chloride: 107 mmol/L (ref 98–111)
Creatinine: 0.66 mg/dL (ref 0.44–1.00)
GFR, Estimated: >60 mL/min (ref >60)
Glucose, Bld: 84 mg/dL (ref 70–99)
Potassium: 4.2 mmol/L (ref 3.5–5.1)
Sodium: 139 mmol/L (ref 135–145)
Total Bilirubin: 0.4 mg/dL (ref 0.3–1.2)
Total Protein: 7.6 g/dL (ref 6.5–8.1)

## 2021-11-18 LAB — CBC WITH DIFFERENTIAL (CANCER CENTER ONLY)
Abs Immature Granulocytes: 0.02 10*3/uL (ref 0.00–0.07)
Basophils Absolute: 0 10*3/uL (ref 0.0–0.1)
Basophils Relative: 0 %
Eosinophils Absolute: 0.2 10*3/uL (ref 0.0–0.5)
Eosinophils Relative: 2 %
HCT: 39.8 % (ref 36.0–46.0)
Hemoglobin: 13.2 g/dL (ref 12.0–15.0)
Immature Granulocytes: 0 %
Lymphocytes Relative: 27 %
Lymphs Abs: 2.6 10*3/uL (ref 0.7–4.0)
MCH: 32 pg (ref 26.0–34.0)
MCHC: 33.2 g/dL (ref 30.0–36.0)
MCV: 96.4 fL (ref 80.0–100.0)
Monocytes Absolute: 0.7 10*3/uL (ref 0.1–1.0)
Monocytes Relative: 8 %
Neutro Abs: 5.9 10*3/uL (ref 1.7–7.7)
Neutrophils Relative %: 63 %
Platelet Count: 290 10*3/uL (ref 150–400)
RBC: 4.13 MIL/uL (ref 3.87–5.11)
RDW: 12.2 % (ref 11.5–15.5)
WBC Count: 9.4 10*3/uL (ref 4.0–10.5)
nRBC: 0 % (ref 0.0–0.2)

## 2021-11-18 LAB — IRON AND IRON BINDING CAPACITY (CC-WL,HP ONLY)
Iron: 43 ug/dL (ref 28–170)
Saturation Ratios: 10 % — ABNORMAL LOW (ref 10.4–31.8)
TIBC: 451 ug/dL — ABNORMAL HIGH (ref 250–450)
UIBC: 408 ug/dL (ref 148–442)

## 2021-11-18 LAB — RETIC PANEL
Immature Retic Fract: 12.4 % (ref 2.3–15.9)
RBC.: 4.02 MIL/uL (ref 3.87–5.11)
Retic Count, Absolute: 59.9 10*3/uL (ref 19.0–186.0)
Retic Ct Pct: 1.5 % (ref 0.4–3.1)
Reticulocyte Hemoglobin: 32.3 pg (ref >27.9)

## 2021-11-18 LAB — VITAMIN B12: Vitamin B-12: 294 pg/mL (ref 180–914)

## 2021-11-18 LAB — FERRITIN: Ferritin: 5 ng/mL — ABNORMAL LOW (ref 11–307)

## 2021-11-20 LAB — METHYLMALONIC ACID, SERUM: Methylmalonic Acid, Quantitative: 230 nmol/L (ref 0–378)

## 2021-11-28 ENCOUNTER — Inpatient Hospital Stay: Payer: 59 | Admitting: Physician Assistant
